# Patient Record
Sex: Female | Born: 1975 | Race: White | Hispanic: No | Marital: Married | Smoking: Current every day smoker
Health system: Southern US, Community
[De-identification: ages and names within clinical notes are randomized; demographics above are authoritative.]

## PROBLEM LIST (undated history)

## (undated) ENCOUNTER — Emergency Department (HOSPITAL_COMMUNITY): Admission: EM | Payer: Medicaid Other | Source: Home / Self Care

## (undated) DIAGNOSIS — Z789 Other specified health status: Secondary | ICD-10-CM

## (undated) DIAGNOSIS — C801 Malignant (primary) neoplasm, unspecified: Secondary | ICD-10-CM

## (undated) DIAGNOSIS — T783XXA Angioneurotic edema, initial encounter: Secondary | ICD-10-CM

## (undated) DIAGNOSIS — T7840XA Allergy, unspecified, initial encounter: Secondary | ICD-10-CM

## (undated) HISTORY — PX: DENTAL SURGERY: SHX609

## (undated) HISTORY — DX: Angioneurotic edema, initial encounter: T78.3XXA

## (undated) HISTORY — DX: Allergy, unspecified, initial encounter: T78.40XA

## (undated) HISTORY — DX: Malignant (primary) neoplasm, unspecified: C80.1

---
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
## 2004-05-31 ENCOUNTER — Emergency Department (HOSPITAL_COMMUNITY): Admission: EM | Admit: 2004-05-31 | Discharge: 2004-05-31 | Payer: Self-pay | Admitting: Emergency Medicine

## 2005-08-03 ENCOUNTER — Emergency Department (HOSPITAL_COMMUNITY): Admission: EM | Admit: 2005-08-03 | Discharge: 2005-08-04 | Payer: Self-pay | Admitting: Emergency Medicine

## 2007-04-28 ENCOUNTER — Emergency Department (HOSPITAL_COMMUNITY): Admission: EM | Admit: 2007-04-28 | Discharge: 2007-04-28 | Payer: Self-pay | Admitting: Emergency Medicine

## 2007-06-26 ENCOUNTER — Emergency Department (HOSPITAL_COMMUNITY): Admission: EM | Admit: 2007-06-26 | Discharge: 2007-06-26 | Payer: Self-pay | Admitting: Emergency Medicine

## 2008-01-26 ENCOUNTER — Emergency Department (HOSPITAL_COMMUNITY): Admission: EM | Admit: 2008-01-26 | Discharge: 2008-01-26 | Payer: Self-pay | Admitting: Emergency Medicine

## 2008-01-31 ENCOUNTER — Emergency Department (HOSPITAL_COMMUNITY): Admission: EM | Admit: 2008-01-31 | Discharge: 2008-01-31 | Payer: Self-pay | Admitting: Emergency Medicine

## 2008-07-05 ENCOUNTER — Emergency Department (HOSPITAL_BASED_OUTPATIENT_CLINIC_OR_DEPARTMENT_OTHER): Admission: EM | Admit: 2008-07-05 | Discharge: 2008-07-05 | Payer: Self-pay | Admitting: Emergency Medicine

## 2008-11-28 ENCOUNTER — Inpatient Hospital Stay (HOSPITAL_COMMUNITY): Admission: AD | Admit: 2008-11-28 | Discharge: 2008-11-28 | Payer: Self-pay | Admitting: Obstetrics

## 2009-02-23 ENCOUNTER — Inpatient Hospital Stay (HOSPITAL_COMMUNITY): Admission: AD | Admit: 2009-02-23 | Discharge: 2009-02-26 | Payer: Self-pay | Admitting: Obstetrics

## 2009-10-22 ENCOUNTER — Emergency Department (HOSPITAL_COMMUNITY): Admission: EM | Admit: 2009-10-22 | Discharge: 2009-10-22 | Payer: Self-pay | Admitting: Family Medicine

## 2010-02-04 ENCOUNTER — Ambulatory Visit: Payer: Self-pay | Admitting: Obstetrics & Gynecology

## 2010-02-04 ENCOUNTER — Emergency Department (HOSPITAL_COMMUNITY): Admission: EM | Admit: 2010-02-04 | Discharge: 2010-02-04 | Payer: Self-pay | Admitting: Emergency Medicine

## 2010-02-04 ENCOUNTER — Inpatient Hospital Stay (HOSPITAL_COMMUNITY): Admission: AD | Admit: 2010-02-04 | Discharge: 2010-02-04 | Payer: Self-pay | Admitting: Obstetrics & Gynecology

## 2010-02-22 ENCOUNTER — Ambulatory Visit: Payer: Self-pay | Admitting: Obstetrics and Gynecology

## 2010-03-10 ENCOUNTER — Emergency Department (HOSPITAL_COMMUNITY): Admission: EM | Admit: 2010-03-10 | Discharge: 2010-03-10 | Payer: Self-pay | Admitting: Family Medicine

## 2011-01-23 LAB — POCT INFECTIOUS MONO SCREEN: Mono Screen: NEGATIVE

## 2011-01-23 LAB — POCT RAPID STREP A (OFFICE): Streptococcus, Group A Screen (Direct): NEGATIVE

## 2011-02-05 LAB — CULTURE, ROUTINE-ABSCESS

## 2011-02-14 LAB — CBC
HCT: 27.6 % — ABNORMAL LOW (ref 36.0–46.0)
HCT: 37.8 % (ref 36.0–46.0)
Hemoglobin: 13 g/dL (ref 12.0–15.0)
Hemoglobin: 9.4 g/dL — ABNORMAL LOW (ref 12.0–15.0)
MCHC: 34.2 g/dL (ref 30.0–36.0)
MCHC: 34.5 g/dL (ref 30.0–36.0)
MCV: 92 fL (ref 78.0–100.0)
MCV: 93.1 fL (ref 78.0–100.0)
Platelets: 143 10*3/uL — ABNORMAL LOW (ref 150–400)
Platelets: 164 10*3/uL (ref 150–400)
RBC: 2.97 MIL/uL — ABNORMAL LOW (ref 3.87–5.11)
RBC: 4.11 MIL/uL (ref 3.87–5.11)
RDW: 13.7 % (ref 11.5–15.5)
RDW: 13.8 % (ref 11.5–15.5)
WBC: 16.8 10*3/uL — ABNORMAL HIGH (ref 4.0–10.5)
WBC: 17.3 10*3/uL — ABNORMAL HIGH (ref 4.0–10.5)

## 2011-02-14 LAB — RPR: RPR Ser Ql: NONREACTIVE

## 2011-03-20 NOTE — Op Note (Signed)
Shari Stewart, Shari Stewart              ACCOUNT NO.:  [id number]   MEDICAL RECORD NO.:  [id number]          PATIENT TYPE:  INP   LOCATION:  9110                          FACILITY:  [facility]   PHYSICIAN:  Kathreen Cosier, M.D.DATE OF BIRTH:  Jan 08, 1976   DATE OF PROCEDURE:  02/24/2009  DATE OF DISCHARGE:                               OPERATIVE REPORT   PREOPERATIVE DIAGNOSIS:  Failure to progress in labor at term.   POSTOPERATIVE DIAGNOSIS:  Failure to progress in labor at term.   SURGEON:  Kathreen Cosier, M.D.   ANESTHESIA:  Epidural.   DESCRIPTION OF PROCEDURE:  The patient placed on the operating table in  supine position.  Abdomen prepped and draped.  Bladder emptied with a  Foley catheter.  Transverse suprapubic incision made, carried down to  rectus fascia.  Fascia cleaned and incised to the length of the  incision.  Recti muscles retracted laterally.  Peritoneum incised  longitudinally.  Transverse incision made in the visceral peritoneum  above the bladder.  Bladder mobilized inferiorly.  Transverse lower  uterine incision made.  The patient delivered from the OP position of a  female Apgar are 9 and 9, weighing 8 pounds 11 ounces.  Team was in  attendance.  Fluid clear.  Placenta anterior removed manually and sent  to Labor and Delivery.  Uterine cavity cleaned with dry laps.  Uterine  incision closed in 2 layers with continuous suture of #1 chromic.  Hemostasis was satisfactory.  Bladder flap reattached with 2-0 chromic.  Uterus well contracted.  Tubes and ovaries normal.  Abdomen was closed  in layers, peritoneum continuous suture of 0 chromic, fascia with  continuous suture with 0 Dexon, and skin closed with subcuticular stitch  of 4-0 Monocryl.  Blood loss 600 mL.           ______________________________  Kathreen Cosier, M.D.     BAM/MEDQ  D:  02/24/2009  T:  02/24/2009  Job:  [id number]

## 2011-03-20 NOTE — H&P (Signed)
NAMEBRIANNE, Shari Stewart              ACCOUNT NO.:  [id number]   MEDICAL RECORD NO.:  [id number]          PATIENT TYPE:  INP   LOCATION:  9110                          [facility]ACILITY:  [facility]   PHYSICIAN:  Kathreen Cosier, M.D.DATE OF BIRTH:  09-24-1976   DATE OF ADMISSION:  02/23/2009  DATE OF DISCHARGE:                              HISTORY & PHYSICAL   The patient is a 35 year old gravida 6, para 0-0-5-0, [facility] February 21, 2009, who was admitted to labor.  She had a positive group B  Streptococcus, and at 7:30 p.m. on February 23, 2009, she was 9 cm vertex  and -1 station.  Membranes were  ruptured at 7 a.m., fluid was clear.  She had a positive GBS and got penicillin on admission.  The patient  soon became fully dilated and by 11:55 p.m. had been pushing for 3 hours  with no descent of the vertex beyond of +1 station and there was  significant molding.  It was decided she would be delivered by a C-  section because of failure to progress in labor.   PHYSICAL EXAMINATION:  GENERAL:  A well-developed female in labor.  HEENT:  Negative.  LUNGS:  Clear.  HEART:  Regular rhythm.  No murmurs or gallops.  BREASTS:  No masses.  ABDOMEN:  Term-size uterus.  Estimated fetal weight was 7 or 8.  PELVIC:  As described above.  EXTREMITIES:  Negative.           ______________________________  Kathreen Cosier, M.D.     BAM/MEDQ  D:  02/24/2009  T:  02/24/2009  Job:  [id number]

## 2011-03-20 NOTE — Discharge Summary (Signed)
[name], Shari Stewart              ACCOUNT NO.:  [id number]   MEDICAL RECORD NO.:  [id number]          PATIENT TYPE:  INP   LOCATION:  9110                          [facility]   PHYSICIAN:  Roseanna Rainbow, M.D.DATE OF BIRTH:  22-Mar-1976   DATE OF ADMISSION:  02/23/2009  DATE OF DISCHARGE:  02/26/2009                               DISCHARGE SUMMARY   CHIEF COMPLAINT:  The patient is a 35 year old gravida 6, para 0 with an  estimated date of confinement of April 19, complaining of contractions.   HISTORY OF PRESENT ILLNESS:  Please see the above OB risk factors.  GBS  positive.   REVIEW OF SYSTEMS:  Please see the above.   PHYSICAL EXAMINATION:  VITAL SIGNS:  Stable, afebrile.  HEAD, EYES, EARS, NOSE AND THROAT:  Normocephalic and atraumatic.  LUNGS:  Clear to auscultation bilaterally.  HEART:  Regular rate and rhythm.  No murmurs, rubs, or gallops.  ABDOMEN:  Term.  EXTREMITIES:  Nontender.  PELVIC:  Cervix 9 cm, dilated with a vertex at a -1 station; the  membranes were artificially ruptured for clear fluid.   ASSESSMENT:  Intrauterine pregnancy at term, active labor.   PLAN:  Admission, expectant management, anticipated vaginal delivery.   HOSPITAL COURSE:  The patient was admitted.  She progressed to fully  dilated.  However, there was arrest of descent and she was brought for a  cesarean delivery.  Please see the dictated operative summary.  On  postoperative day #1, her hemoglobin was 9.4.  A psychosocial assessment  was performed for possible history of alcoholism.  However, upon further  queries, the patient had previously been charged with a DUI, denied any  substance abuse or alcoholism.  The meconium was sent for a drug screen.  The remainder of the patient's hospital course was uneventful.  She was  discharged to home on postoperative day 2.   DISCHARGE DIAGNOSES:  Intrauterine pregnancy at term, arrest of descent,  active labor.   PROCEDURE:  Cesarean  delivery.   CONDITION:  Stable.   DIET:  Regular.   ACTIVITY:  Pelvic rest, progressive activity.   MEDICATIONS:  Percocet 1-2 tablets every 6 hours as needed.   DISPOSITION:  The patient was to arrange followup with Dr. Gaynell Face for  a postpartum check in 6 weeks and to schedule a circumcision for the  infant.      Roseanna Rainbow, M.D.  Electronically Signed     LAJ/MEDQ  D:  02/26/2009  T:  02/26/2009  Job:  [id number]   cc:   Kathreen Cosier, M.D.  Fax: [phone]

## 2011-06-19 ENCOUNTER — Inpatient Hospital Stay (INDEPENDENT_AMBULATORY_CARE_PROVIDER_SITE_OTHER)
Admission: RE | Admit: 2011-06-19 | Discharge: 2011-06-19 | Disposition: A | Discharge status: Home / Self Care | Payer: Self-pay | Source: Ambulatory Visit | Attending: Family Medicine | Admitting: Family Medicine

## 2011-06-19 DIAGNOSIS — W57XXXA Bitten or stung by nonvenomous insect and other nonvenomous arthropods, initial encounter: Secondary | ICD-10-CM

## 2011-07-30 LAB — CULTURE, ROUTINE-ABSCESS: Culture: NO GROWTH

## 2011-08-17 LAB — BASIC METABOLIC PANEL
BUN: 7
CO2: 26
Calcium: 9
Chloride: 104
Creatinine, Ser: 0.68
GFR calc Af Amer: >60
GFR calc non Af Amer: >60
Glucose, Bld: 98
Potassium: 4
Sodium: 139

## 2011-08-17 LAB — ETHANOL: Alcohol, Ethyl (B): 107 — ABNORMAL HIGH

## 2012-06-15 ENCOUNTER — Emergency Department (HOSPITAL_COMMUNITY)
Admission: EM | Admit: 2012-06-15 | Discharge: 2012-06-15 | Disposition: A | Discharge status: Home / Self Care | Payer: Self-pay | Attending: Emergency Medicine | Admitting: Emergency Medicine

## 2012-06-15 ENCOUNTER — Encounter (HOSPITAL_COMMUNITY): Payer: Self-pay | Admitting: Physical Medicine and Rehabilitation

## 2012-06-15 DIAGNOSIS — F17200 Nicotine dependence, unspecified, uncomplicated: Secondary | ICD-10-CM | POA: Insufficient documentation

## 2012-06-15 DIAGNOSIS — J020 Streptococcal pharyngitis: Secondary | ICD-10-CM | POA: Insufficient documentation

## 2012-06-15 LAB — RAPID STREP SCREEN (MED CTR MEBANE ONLY): Streptococcus, Group A Screen (Direct): POSITIVE — AB

## 2012-06-15 MED ORDER — DEXAMETHASONE SODIUM PHOSPHATE 10 MG/ML IJ SOLN
10.0000 mg | Freq: Once | INTRAMUSCULAR | Status: DC
  Filled 2012-06-15: qty 1

## 2012-06-15 MED ORDER — DEXAMETHASONE SODIUM PHOSPHATE 10 MG/ML IJ SOLN
10.0000 mg | Freq: Once | INTRAMUSCULAR | Status: AC
  Administered 2012-06-15: 10 mg via INTRAMUSCULAR

## 2012-06-15 MED ORDER — PENICILLIN G BENZATHINE 1200000 UNIT/2ML IM SUSP
1.2000 10*6.[IU] | Freq: Once | INTRAMUSCULAR | Status: AC
  Administered 2012-06-15: 1.2 10*6.[IU] via INTRAMUSCULAR
  Filled 2012-06-15: qty 2

## 2012-06-15 NOTE — ED Notes (Signed)
Pt presents to department for evaluation of sore throat and fever. Ongoing x1 day. Pt states fever of 100.2, also states difficulty swallowing and white exudate on both tonsils. 8/10 pain at the time. She is alert and oriented x4. Respirations unlabored. No signs of distress noted.

## 2012-06-15 NOTE — ED Provider Notes (Signed)
History     CSN: [id number]  Arrival date & time 06/15/12  4098   First MD Initiated Contact with Patient 06/15/12 [phone]      Chief Complaint  Patient presents with  . Sore Throat    (Consider location/radiation/quality/duration/timing/severity/associated sxs/prior treatment) Patient is a 36 y.o. female presenting with pharyngitis. The history is provided by the patient.  Sore Throat This is a new problem. The current episode started yesterday. The problem occurs constantly. The problem has been gradually worsening. Pertinent negatives include no chest pain, no abdominal pain and no shortness of breath. Associated symptoms comments: Fever, no cough, rhinorrhea or other complaints. The symptoms are aggravated by swallowing. Nothing relieves the symptoms. Treatments tried: NSAIDs. The treatment provided mild relief.    No past medical history on file.  No past surgical history on file.  No family history on file.  History  Substance Use Topics  . Smoking status: Current Everyday Smoker    Types: Cigarettes  . Smokeless tobacco: Not on file  . Alcohol Use: No    OB History    Grav Para Term Preterm Abortions TAB SAB Ect Mult Living                  Review of Systems  Respiratory: Negative for cough and shortness of breath.   Cardiovascular: Negative for chest pain.  Gastrointestinal: Negative for nausea, vomiting and abdominal pain.  All other systems reviewed and are negative.    Allergies  Bee pollen and Erythromycin  Home Medications   Current Outpatient Rx  Name Route Sig Dispense Refill  . IBUPROFEN 200 MG PO TABS Oral Take 400-600 mg by mouth every 8 (eight) hours as needed. For pain      BP 112/79  Pulse 92  Temp 98.3 F (36.8 C) (Oral)  Resp 16  SpO2 98%  Physical Exam  Nursing note and vitals reviewed. Constitutional: She is oriented to person, place, and time. She appears well-developed and well-nourished. No distress.  HENT:  Head:  Normocephalic and atraumatic.  Right Ear: Tympanic membrane and ear canal normal.  Left Ear: Tympanic membrane and ear canal normal.  Nose: No mucosal edema or rhinorrhea.  Mouth/Throat: Oropharyngeal exudate and posterior oropharyngeal erythema present. No tonsillar abscesses.  Eyes: Conjunctivae and EOM are normal. Pupils are equal, round, and reactive to light.  Neck: Normal range of motion. Neck supple.  Cardiovascular: Normal rate, regular rhythm and intact distal pulses.   No murmur heard. Pulmonary/Chest: Effort normal and breath sounds normal. No respiratory distress. She has no wheezes. She has no rales.  Abdominal: Soft. She exhibits no distension. There is no tenderness. There is no rebound and no guarding.  Musculoskeletal: Normal range of motion. She exhibits no edema and no tenderness.  Lymphadenopathy:    She has cervical adenopathy.  Neurological: She is alert and oriented to person, place, and time.  Skin: Skin is warm and dry. No rash noted. No erythema.  Psychiatric: She has a normal mood and affect. Her behavior is normal.    ED Course  Procedures (including critical care time)   Labs Reviewed  RAPID STREP SCREEN   No results found.   1. Strep pharyngitis       MDM   Patient with a history consistent with strep throat. She has 4 out of 4 Center criteria with exudates on her tonsils, lymphadenopathy, no cough and a fever yesterday. Last episode of strep throat the patient had was 3 years ago. No  known sick contacts.  Patient was treated with IM penicillin and Decadron       Gwyneth Sprout, MD 06/15/12 [phone]

## 2012-06-15 NOTE — ED Notes (Signed)
Pt discharged home. Had no further questions. 

## 2012-06-15 NOTE — Discharge Instructions (Signed)

## 2013-05-18 ENCOUNTER — Other Ambulatory Visit: Payer: Self-pay | Admitting: Obstetrics & Gynecology

## 2013-05-26 ENCOUNTER — Encounter (HOSPITAL_COMMUNITY): Payer: Self-pay | Admitting: *Deleted

## 2013-06-01 ENCOUNTER — Inpatient Hospital Stay (HOSPITAL_COMMUNITY): Payer: BC Managed Care – PPO | Admitting: Anesthesiology

## 2013-06-01 ENCOUNTER — Ambulatory Visit (HOSPITAL_COMMUNITY)
Admission: RE | Admit: 2013-06-01 | Discharge: 2013-06-01 | Disposition: A | Discharge status: Home / Self Care | Payer: BC Managed Care – PPO | Source: Ambulatory Visit | Attending: Obstetrics & Gynecology | Admitting: Obstetrics & Gynecology

## 2013-06-01 ENCOUNTER — Encounter (HOSPITAL_COMMUNITY): Payer: Self-pay | Admitting: Anesthesiology

## 2013-06-01 ENCOUNTER — Encounter (HOSPITAL_COMMUNITY): Payer: Self-pay | Admitting: Pharmacy Technician

## 2013-06-01 ENCOUNTER — Encounter (HOSPITAL_COMMUNITY)
Admission: RE | Disposition: A | Discharge status: Home / Self Care | Payer: Self-pay | Source: Ambulatory Visit | Attending: Obstetrics & Gynecology

## 2013-06-01 DIAGNOSIS — N750 Cyst of Bartholin's gland: Secondary | ICD-10-CM | POA: Insufficient documentation

## 2013-06-01 HISTORY — DX: Other specified health status: Z78.9

## 2013-06-01 HISTORY — PX: BARTHOLIN CYST MARSUPIALIZATION: SHX5383

## 2013-06-01 LAB — CBC
HCT: 41.4 % (ref 36.0–46.0)
Hemoglobin: 14.1 g/dL (ref 12.0–15.0)
MCH: 31.3 pg (ref 26.0–34.0)
MCHC: 34.1 g/dL (ref 30.0–36.0)
MCV: 91.8 fL (ref 78.0–100.0)
Platelets: 212 10*3/uL (ref 150–400)
RBC: 4.51 MIL/uL (ref 3.87–5.11)
RDW: 13.4 % (ref 11.5–15.5)
WBC: 6 10*3/uL (ref 4.0–10.5)

## 2013-06-01 LAB — PREGNANCY, URINE: Preg Test, Ur: NEGATIVE

## 2013-06-01 SURGERY — MARSUPIALIZATION, CYST, BARTHOLIN'S GLAND
Anesthesia: General | Site: Vagina | Laterality: Right | Wound class: Clean Contaminated

## 2013-06-01 MED ORDER — KETOROLAC TROMETHAMINE 30 MG/ML IJ SOLN
INTRAMUSCULAR | Status: AC
  Filled 2013-06-01: qty 1

## 2013-06-01 MED ORDER — FENTANYL CITRATE 0.05 MG/ML IJ SOLN: 25.0000 ug | INTRAMUSCULAR | Status: DC | PRN

## 2013-06-01 MED ORDER — PROPOFOL 10 MG/ML IV EMUL
INTRAVENOUS | Status: AC
  Filled 2013-06-01: qty 20

## 2013-06-01 MED ORDER — MIDAZOLAM HCL 5 MG/5ML IJ SOLN
INTRAMUSCULAR | Status: DC | PRN
  Administered 2013-06-01: 2 mg via INTRAVENOUS

## 2013-06-01 MED ORDER — MIDAZOLAM HCL 2 MG/2ML IJ SOLN
INTRAMUSCULAR | Status: AC
  Filled 2013-06-01: qty 2

## 2013-06-01 MED ORDER — MIDAZOLAM HCL 2 MG/2ML IJ SOLN: 0.5000 mg | Freq: Once | INTRAMUSCULAR | Status: DC | PRN

## 2013-06-01 MED ORDER — LIDOCAINE HCL (CARDIAC) 20 MG/ML IV SOLN
INTRAVENOUS | Status: AC
  Filled 2013-06-01: qty 5

## 2013-06-01 MED ORDER — LIDOCAINE HCL (CARDIAC) 20 MG/ML IV SOLN
INTRAVENOUS | Status: DC | PRN
  Administered 2013-06-01: 30 mg via INTRAVENOUS
  Administered 2013-06-01: 70 mg via INTRAVENOUS

## 2013-06-01 MED ORDER — FENTANYL CITRATE 0.05 MG/ML IJ SOLN
INTRAMUSCULAR | Status: DC | PRN
  Administered 2013-06-01 (×3): 50 ug via INTRAVENOUS

## 2013-06-01 MED ORDER — ONDANSETRON HCL 4 MG/2ML IJ SOLN
INTRAMUSCULAR | Status: AC
  Filled 2013-06-01: qty 2

## 2013-06-01 MED ORDER — PROMETHAZINE HCL 25 MG/ML IJ SOLN: 6.2500 mg | INTRAMUSCULAR | Status: DC | PRN

## 2013-06-01 MED ORDER — LACTATED RINGERS IV SOLN
INTRAVENOUS | Status: DC
  Administered 2013-06-01 (×2): via INTRAVENOUS

## 2013-06-01 MED ORDER — PROPOFOL 10 MG/ML IV BOLUS
INTRAVENOUS | Status: DC | PRN
  Administered 2013-06-01: 200 mg via INTRAVENOUS

## 2013-06-01 MED ORDER — KETOROLAC TROMETHAMINE 30 MG/ML IJ SOLN
INTRAMUSCULAR | Status: DC | PRN
  Administered 2013-06-01 (×2): 30 mg via INTRAVENOUS

## 2013-06-01 MED ORDER — LIDOCAINE HCL 2 % IJ SOLN
INTRAMUSCULAR | Status: AC
  Filled 2013-06-01: qty 20

## 2013-06-01 MED ORDER — HYDROCODONE-IBUPROFEN 5-200 MG PO TABS: 1.0000 | ORAL_TABLET | Freq: Three times a day (TID) | ORAL | Status: DC | PRN

## 2013-06-01 MED ORDER — MEPERIDINE HCL 25 MG/ML IJ SOLN: 6.2500 mg | INTRAMUSCULAR | Status: DC | PRN

## 2013-06-01 MED ORDER — ONDANSETRON HCL 4 MG/2ML IJ SOLN
INTRAMUSCULAR | Status: DC | PRN
  Administered 2013-06-01: 4 mg via INTRAVENOUS

## 2013-06-01 MED ORDER — KETOROLAC TROMETHAMINE 30 MG/ML IJ SOLN: 15.0000 mg | Freq: Once | INTRAMUSCULAR | Status: DC | PRN

## 2013-06-01 MED ORDER — FENTANYL CITRATE 0.05 MG/ML IJ SOLN
INTRAMUSCULAR | Status: AC
  Filled 2013-06-01: qty 5

## 2013-06-01 MED ORDER — LIDOCAINE HCL 2 % IJ SOLN
INTRAMUSCULAR | Status: DC | PRN
  Administered 2013-06-01: 10 mL

## 2013-06-01 SURGICAL SUPPLY — 27 items
BLADE SURG 15 STRL LF C SS BP (BLADE) ×1 IMPLANT
BLADE SURG 15 STRL SS (BLADE) ×2
CLOTH BEACON ORANGE TIMEOUT ST (SAFETY) ×2 IMPLANT
CONTAINER PREFILL 10% NBF 15ML (MISCELLANEOUS) IMPLANT
COUNTER NEEDLE 1200 MAGNETIC (NEEDLE) IMPLANT
DRESSING TELFA 8X3 (GAUZE/BANDAGES/DRESSINGS) ×1 IMPLANT
ELECT REM PT RETURN 9FT ADLT (ELECTROSURGICAL)
ELECTRODE REM PT RTRN 9FT ADLT (ELECTROSURGICAL) IMPLANT
GAUZE PACKING 1 X5 YD ST (GAUZE/BANDAGES/DRESSINGS) ×1 IMPLANT
GAUZE PACKING 1/2 X5 YD (GAUZE/BANDAGES/DRESSINGS) IMPLANT
GLOVE BIO SURGEON STRL SZ 6.5 (GLOVE) ×2 IMPLANT
GLOVE BIOGEL PI IND STRL 7.0 (GLOVE) ×1 IMPLANT
GLOVE BIOGEL PI INDICATOR 7.0 (GLOVE) ×1
GOWN STRL REIN XL XLG (GOWN DISPOSABLE) ×4 IMPLANT
NEEDLE HYPO 22GX1.5 SAFETY (NEEDLE) ×1 IMPLANT
PACK VAGINAL MINOR WOMEN LF (CUSTOM PROCEDURE TRAY) ×2 IMPLANT
PAD OB MATERNITY 4.3X12.25 (PERSONAL CARE ITEMS) ×2 IMPLANT
PAD PREP 24X48 CUFFED NSTRL (MISCELLANEOUS) ×2 IMPLANT
PENCIL BUTTON HOLSTER BLD 10FT (ELECTRODE) IMPLANT
SUT VICRYL RAPIDE 4/0 PS 2 (SUTURE) ×6 IMPLANT
SWAB CULTURE LIQ STUART DBL (MISCELLANEOUS) IMPLANT
SYR CONTROL 10ML LL (SYRINGE) ×1 IMPLANT
TOWEL OR 17X24 6PK STRL BLUE (TOWEL DISPOSABLE) ×4 IMPLANT
TUBE ANAEROBIC SPECIMEN COL (MISCELLANEOUS) IMPLANT
TUBING NON-CON 1/4 X 20 CONN (TUBING) IMPLANT
WATER STERILE IRR 1000ML POUR (IV SOLUTION) ×3 IMPLANT
YANKAUER SUCT BULB TIP NO VENT (SUCTIONS) IMPLANT

## 2013-06-01 NOTE — Op Note (Signed)
06/01/2013  8:30 AM  PATIENT:  Shari Stewart  37 y.o. female  PRE-OPERATIVE DIAGNOSIS:  Recurrent right Bartholin's Gland Cyst    POST-OPERATIVE DIAGNOSIS:  Recurrent right Bartholin's Gland cyst   PROCEDURE:  Procedure(s): RIGHT BARTHOLIN CYST MARSUPIALIZATION  SURGEON:  Surgeon(s): Genia Del, MD  ASSISTANTS: none   ANESTHESIA:   general  ESTIMATED BLOOD LOSS: 621308   Intake/Output Summary (Last 24 hours) at 06/01/13 0830 Last data filed at 06/01/13 6578  Gross per 24 hour  Intake      0 ml  Output     60 ml  Net    -60 ml     BLOOD ADMINISTERED:none   LOCAL MEDICATIONS USED:  LIDOCAINE 2% 10 cc   SPECIMEN:  No Specimen  DISPOSITION OF SPECIMEN:  N/A  COUNTS:  YES  DICTATION #:   PLAN OF CARE: Transfer to PACU  Genia Del MD  06/01/2013   8:30 am

## 2013-06-01 NOTE — Discharge Summary (Signed)
  Physician Discharge Summary  Patient ID: Shari Stewart MRN: [id number] DOB/AGE: [birth year] 37 y.o.  Admit date: 06/01/2013 Discharge date: 06/01/2013  Admission Diagnoses: Bartholin's Gland Abscess  40981  Discharge Diagnoses: Bartholin's Gland Abscess  19147        Active Problems:   * No active hospital problems. *   Discharged Condition: good  Hospital Course: Outpatient  Consults: None  Treatments: surgery: Right Bartholin gland cyst Marsupialisation  Disposition: 01-Home or Self Care     Medication List         amphetamine-dextroamphetamine 10 MG tablet  Commonly known as:  ADDERALL  Take 10 mg by mouth daily.     cetirizine 10 MG tablet  Commonly known as:  ZYRTEC  Take 10 mg by mouth daily.     hydrocodone-ibuprofen 5-200 MG per tablet  Commonly known as:  VICOPROFEN  Take 1 tablet by mouth every 8 (eight) hours as needed for pain.     ibuprofen 200 MG tablet  Commonly known as:  ADVIL,MOTRIN  Take 400-600 mg by mouth every 8 (eight) hours as needed. For pain     lisdexamfetamine 70 MG capsule  Commonly known as:  VYVANSE  Take 70 mg by mouth daily.     NEXPLANON 68 MG Impl implant  Generic drug:  etonogestrel  Inject 1 each into the skin once.     valACYclovir 500 MG tablet  Commonly known as:  VALTREX  Take 500 mg by mouth daily as needed (for cold sores).     Vitamin D (Ergocalciferol) 50000 UNITS Caps  Commonly known as:  DRISDOL  Take 50,000 Units by mouth every 7 (seven) days. thursdays           Follow-up Information   Follow up with Leni Pankonin,MARIE-LYNE, MD In 3 weeks.   Contact information:   [address] [place] [address] [phone]       Signed: Genia Del, MD 06/01/2013, 8:42 AM

## 2013-06-01 NOTE — Op Note (Signed)
Shari Stewart, [place] NO.:  [id number]  MEDICAL RECORD NO.:  [id number]  LOCATION:  [facility]                          [facility]ILITY[facility]  PHYSICIAN:  Genia Del, M.D.DATE OF BIRTH:  1976/01/01  DATE OF PROCEDURE:  06/01/2013 DATE OF DISCHARGE:  06/01/2013                              OPERATIVE REPORT   PREOPERATIVE DIAGNOSIS:  Recurrent right Bartholin cyst.  POSTOPERATIVE DIAGNOSIS:  Recurrent right Bartholin cyst.  PROCEDURE:  Right Bartholin cyst marsupialization.  SURGEON:  Genia Del, M.D.  ASSISTANT:  None.  ANESTHESIOLOGIST:  Belva Agee, M.D.  ANESTHESIA:  General.  PROCEDURE:  Under general anesthesia with laryngeal mask, the patient was in lithotomy position.  She was prepped with Hibiclens on the suprapubic vulvar and vaginal areas.  The bladder was catheterized.  We draped the patient as usual.  Infiltration of lidocaine 2%, 10 mL at the right Bartholin gland cyst, close to the hymen.  We make an incision over 1.5 cm with a scalpel.  We anterior the Bartholin gland cyst and a thick mucusy transparent fluid evacuates.  We used Vicryl 4-0 in a separate stitches all around the opening of the Bartholin gland cyst to marsupialize.  Each stitch includes the vulvar skin and the wall of the Bartholin gland cyst.  We do about 9 separate stitches.  Hemostasis was completed when necessary with the electrocautery.  We then packed the Bartholin gland with a 1 inch plain pack.  The count of instruments and sponges was complete.  Hemostasis was adequate.  The patient was brought to recovery room in good and stable status.     Genia Del, M.D.     ML/MEDQ  D:  06/01/2013  T:  06/01/2013  Job:  [id number]

## 2013-06-01 NOTE — H&P (Signed)
Shari Stewart is an 37 y.o. female   RP:  Rt Bartholin gland cyst  Pertinent Gynecological History:  Blood transfusions: none Sexually transmitted diseases: no past history  Last mammogram: normal  Last pap: normal     Menstrual History:  Patient's last menstrual period was 03/02/2013.    Past Medical History  Diagnosis Date  . Medical history non-contributory     Past Surgical History  Procedure Laterality Date  . Cesarean section    . Cervical biopsy  w/ loop electrode excision  1991    History reviewed. No pertinent family history.  Social History:  reports that she has been smoking Cigarettes.  She has been smoking about 0.50 packs per day. She does not have any smokeless tobacco history on file. She reports that she drinks about 1.2 ounces of alcohol per week. She reports that she does not use illicit drugs.  Allergies:  Allergies  Allergen Reactions  . Bee Pollen Swelling  . Betadine (Povidone Iodine)     BLISTERS  . Erythromycin Hives and Nausea And Vomiting    Prescriptions prior to admission  Medication Sig Dispense Refill  . amphetamine-dextroamphetamine (ADDERALL) 10 MG tablet Take 10 mg by mouth daily.      . cetirizine (ZYRTEC) 10 MG tablet Take 10 mg by mouth daily.      Marland Kitchen etonogestrel (NEXPLANON) 68 MG IMPL implant Inject 1 each into the skin once.      Marland Kitchen ibuprofen (ADVIL,MOTRIN) 200 MG tablet Take 400-600 mg by mouth every 8 (eight) hours as needed. For pain      . lisdexamfetamine (VYVANSE) 70 MG capsule Take 70 mg by mouth daily.      . valACYclovir (VALTREX) 500 MG tablet Take 500 mg by mouth daily as needed (for cold sores).      . Vitamin D, Ergocalciferol, (DRISDOL) 50000 UNITS CAPS Take 50,000 Units by mouth every 7 (seven) days. thursdays        Blood pressure 119/80, temperature 98.1 F (36.7 C), temperature source Oral, resp. rate 16, height 5' 6.5" (1.689 m), weight 58.514 kg (129 lb), last menstrual period 03/02/2013, SpO2  99.00%.  Rt Bartholin gland cyst.   Results for orders placed during the hospital encounter of 06/01/13 (from the past 24 hour(s))  PREGNANCY, URINE     Status: None   Collection Time    06/01/13  6:28 AM      Result Value Range   Preg Test, Ur NEGATIVE  NEGATIVE  CBC     Status: None   Collection Time    06/01/13  6:35 AM      Result Value Range   WBC 6.0  4.0 - 10.5 K/uL   RBC 4.51  3.87 - 5.11 MIL/uL   Hemoglobin 14.1  12.0 - 15.0 g/dL   HCT 16.1  09.6 - 04.5 %   MCV 91.8  78.0 - 100.0 fL   MCH 31.3  26.0 - 34.0 pg   MCHC 34.1  30.0 - 36.0 g/dL   RDW 40.9  81.1 - 91.4 %   Platelets 212  150 - 400 K/uL    No results found.  Assessment/Plan: Recurrent Rt Bartholin cyst for Marsupialisation.  Shari Stewart,MARIE-LYNE 06/01/2013, 7:45 AM

## 2013-06-01 NOTE — Transfer of Care (Signed)
Immediate Anesthesia Transfer of Care Note  Patient: Shari Stewart  Procedure(s) Performed: Procedure(s): BARTHOLIN CYST MARSUPIALIZATION (Right)  Patient Location: PACU  Anesthesia Type:General  Level of Consciousness: awake, alert , oriented and patient cooperative  Airway & Oxygen Therapy: Patient Spontanous Breathing and Patient connected to nasal cannula oxygen  Post-op Assessment: Report given to PACU RN and Post -op Vital signs reviewed and stable  Post vital signs: Reviewed and stable  Complications: No apparent anesthesia complications

## 2013-06-01 NOTE — Anesthesia Preprocedure Evaluation (Signed)
Anesthesia Evaluation  Patient identified by MRN, date of birth, ID band Patient awake    Reviewed: Allergy & Precautions, H&P , Patient's Chart, lab work & pertinent test results, reviewed documented beta blocker date and time   History of Anesthesia Complications Negative for: history of anesthetic complications  Airway Mallampati: II TM Distance: >3 FB Neck ROM: full    Dental no notable dental hx.    Pulmonary neg pulmonary ROS,  breath sounds clear to auscultation  Pulmonary exam normal       Cardiovascular Exercise Tolerance: Good negative cardio ROS  Rhythm:regular Rate:Normal     Neuro/Psych negative neurological ROS  negative psych ROS   GI/Hepatic negative GI ROS, Neg liver ROS,   Endo/Other  negative endocrine ROS  Renal/GU negative Renal ROS     Musculoskeletal   Abdominal   Peds  Hematology negative hematology ROS (+)   Anesthesia Other Findings   Reproductive/Obstetrics negative OB ROS                           Anesthesia Physical Anesthesia Plan  ASA: II  Anesthesia Plan: General LMA   Post-op Pain Management:    Induction:   Airway Management Planned:   Additional Equipment:   Intra-op Plan:   Post-operative Plan:   Informed Consent: I have reviewed the patients History and Physical, chart, labs and discussed the procedure including the risks, benefits and alternatives for the proposed anesthesia with the patient or authorized representative who has indicated his/her understanding and acceptance.   Dental Advisory Given  Plan Discussed with: CRNA, Surgeon and Anesthesiologist  Anesthesia Plan Comments:         Anesthesia Quick Evaluation  

## 2013-06-02 ENCOUNTER — Encounter (HOSPITAL_COMMUNITY): Payer: Self-pay | Admitting: Obstetrics & Gynecology

## 2013-06-08 NOTE — Anesthesia Postprocedure Evaluation (Signed)
  Anesthesia Post-op Note  Patient: Shari Stewart  Procedure(s) Performed: Procedure(s): BARTHOLIN CYST MARSUPIALIZATION (Right) Patient is awake and responsive. Pain and nausea are reasonably well controlled. Vital signs are stable and clinically acceptable. Oxygen saturation is clinically acceptable. There are no apparent anesthetic complications at this time. Patient is ready for discharge.

## 2015-03-06 DEATH — deceased

## 2016-09-02 ENCOUNTER — Other Ambulatory Visit: Payer: Self-pay | Admitting: Advanced Practice Midwife

## 2016-09-02 ENCOUNTER — Encounter (HOSPITAL_COMMUNITY): Payer: Self-pay

## 2016-09-02 ENCOUNTER — Inpatient Hospital Stay (HOSPITAL_COMMUNITY)
Admission: RE | Admit: 2016-09-02 | Discharge: 2016-09-02 | Disposition: A | Discharge status: Home / Self Care | Payer: Medicaid Other | Source: Ambulatory Visit | Attending: Internal Medicine | Admitting: Internal Medicine

## 2016-09-02 DIAGNOSIS — F17210 Nicotine dependence, cigarettes, uncomplicated: Secondary | ICD-10-CM | POA: Insufficient documentation

## 2016-09-02 DIAGNOSIS — Z3202 Encounter for pregnancy test, result negative: Secondary | ICD-10-CM | POA: Diagnosis not present

## 2016-09-02 DIAGNOSIS — L509 Urticaria, unspecified: Secondary | ICD-10-CM | POA: Diagnosis not present

## 2016-09-02 LAB — URINALYSIS, ROUTINE W REFLEX MICROSCOPIC
Bilirubin Urine: NEGATIVE
Glucose, UA: 250 mg/dL — AB
Ketones, ur: 15 mg/dL — AB
Leukocytes, UA: NEGATIVE
Nitrite: NEGATIVE
Protein, ur: NEGATIVE mg/dL
Specific Gravity, Urine: 1.015 (ref 1.005–1.030)
pH: 6 (ref 5.0–8.0)

## 2016-09-02 LAB — POCT PREGNANCY, URINE: Preg Test, Ur: NEGATIVE

## 2016-09-02 LAB — URINE MICROSCOPIC-ADD ON: RBC / HPF: NONE SEEN RBC/hpf (ref 0–5)

## 2016-09-02 MED ORDER — FAMOTIDINE IN NACL 20-0.9 MG/50ML-% IV SOLN
20.0000 mg | Freq: Once | INTRAVENOUS | Status: AC
  Administered 2016-09-02: 20 mg via INTRAVENOUS
  Filled 2016-09-02: qty 50

## 2016-09-02 MED ORDER — PREDNISONE 20 MG PO TABS: ORAL_TABLET | ORAL | 0 refills | Status: DC

## 2016-09-02 MED ORDER — SODIUM CHLORIDE 0.9 % IV SOLN
INTRAVENOUS | Status: DC
  Administered 2016-09-02: 03:00:00 via INTRAVENOUS

## 2016-09-02 MED ORDER — LORATADINE 10 MG PO TABS: 10.0000 mg | ORAL_TABLET | Freq: Every day | ORAL | 1 refills | Status: DC

## 2016-09-02 MED ORDER — FAMOTIDINE 20 MG PO TABS: 20.0000 mg | ORAL_TABLET | Freq: Two times a day (BID) | ORAL | 3 refills | Status: DC

## 2016-09-02 MED ORDER — DIPHENHYDRAMINE HCL 50 MG/ML IJ SOLN
50.0000 mg | Freq: Once | INTRAMUSCULAR | Status: AC
  Administered 2016-09-02: 50 mg via INTRAVENOUS
  Filled 2016-09-02: qty 1

## 2016-09-02 MED ORDER — METHYLPREDNISOLONE SODIUM SUCC 125 MG IJ SOLR
60.0000 mg | Freq: Once | INTRAMUSCULAR | Status: AC
  Administered 2016-09-02: 60 mg via INTRAVENOUS
  Filled 2016-09-02: qty 2

## 2016-09-02 NOTE — MAU Provider Note (Signed)
Chief Complaint: Rash   First Provider Initiated Contact with Patient 09/02/16 0155     SUBJECTIVE HPI: Shari Stewart is a 40 y.o. female who presents to [facility]missions reporting worsening hives. Developed hives on her face, neck, upper chest and shoulders on 08/30/2016. Symptoms worsened yesterday morning so she went to urgent care. Was given a dose of prednisone and Benadryl and prescribed a prednisone Dosepak. She took a total of 110 mg of prednisone over the past 24 hours with the combination of the dose at urgent care and the initial taper dose. Has been taking Benadryl 50 mg every 4-6 hours. Was initially feeling better yesterday afternoon but hives got much worse around her eyes during the night.    No history of hives. Only known allergies are Betadine and erythromycin, but she states she's not been exposed to either of those lately. Has not tried any new foods, detergents, medications or cosmetics.  Associated signs and symptoms: Positive for itching. Negative for fever, chills, shortness of breath, wheezing, swelling of lips or tongue, nausea, vomiting, diarrhea.  Patient got married yesterday and is wondering if there could be something on her wedding dress that caused the reaction. Is planning to leave for her honeymoon to the [place] later this morning.  Past Medical History:  Diagnosis Date  . Medical history non-contributory    OB History  Gravida Para Term Preterm AB Living  1 1 1     1   SAB TAB Ectopic Multiple Live Births               # Outcome Date GA Lbr Len/2nd Weight Sex Delivery Anes PTL Lv  1 Term 02/24/09    Berenice Bouton        Past Surgical History:  Procedure Laterality Date  . BARTHOLIN CYST MARSUPIALIZATION Right 06/01/2013   Procedure: BARTHOLIN CYST MARSUPIALIZATION;  Surgeon: Princess Bruins, MD;  Location: [facility];  Service: Gynecology;  Laterality: Right;  . CERVICAL BIOPSY  W/ LOOP ELECTRODE EXCISION  1991  . CESAREAN SECTION      Social History   Social History  . Marital status: Single    Spouse name: N/A  . Number of children: N/A  . Years of education: N/A   Occupational History  . Not on file.   Social History Main Topics  . Smoking status: Current Every Day Smoker    Packs/day: 0.50    Types: Cigarettes  . Smokeless tobacco: Never Used  . Alcohol use 1.2 oz/week    2 Glasses of wine per week  . Drug use: No  . Sexual activity: Yes    Birth control/ protection: None   Other Topics Concern  . Not on file   Social History Narrative  . No narrative on file   History reviewed. No pertinent family history. Medications Prior to Admission  Medication Sig Dispense Refill  . amphetamine-dextroamphetamine (ADDERALL XR) 30 MG 24 hr capsule Take 30 mg by mouth daily.    Marland Kitchen amphetamine-dextroamphetamine (ADDERALL) 15 MG tablet Take 15 mg by mouth 2 (two) times daily.    . busPIRone (BUSPAR) 15 MG tablet Take 15 mg by mouth 2 (two) times daily.    . cetirizine (ZYRTEC) 10 MG tablet Take 10 mg by mouth daily.    . cloNIDine HCl (KAPVAY) 0.1 MG TB12 ER tablet Take by mouth at bedtime.    Marland Kitchen ibuprofen (ADVIL,MOTRIN) 200 MG tablet Take 400-600 mg by mouth every 8 (eight) hours as needed. For pain    .  valACYclovir (VALTREX) 500 MG tablet Take 500 mg by mouth daily as needed (for cold sores).    Marland Kitchen amphetamine-dextroamphetamine (ADDERALL) 10 MG tablet Take 10 mg by mouth daily.    Marland Kitchen etonogestrel (NEXPLANON) 68 MG IMPL implant Inject 1 each into the skin once.    . hydrocodone-ibuprofen (VICOPROFEN) 5-200 MG per tablet Take 1 tablet by mouth every 8 (eight) hours as needed for pain. 10 tablet 0  . lisdexamfetamine (VYVANSE) 70 MG capsule Take 70 mg by mouth daily.    . Vitamin D, Ergocalciferol, (DRISDOL) 50000 UNITS CAPS Take 50,000 Units by mouth every 7 (seven) days. thursdays      Allergies  Allergen Reactions  . Bee Pollen Swelling  . Betadine [Povidone Iodine]     BLISTERS  . Erythromycin Hives and  Nausea And Vomiting    I have reviewed patient's Past Medical Hx, Surgical Hx, Family Hx, Social Hx, medications and allergies.   Review of Systems  Constitutional: Negative for chills and fever.  HENT: Positive for facial swelling. Negative for trouble swallowing.   Respiratory: Negative for chest tightness, shortness of breath and wheezing.   Cardiovascular: Negative for chest pain.  Gastrointestinal: Negative for diarrhea, nausea and vomiting.  Allergic/Immunologic: Negative for food allergies.    OBJECTIVE Patient Vitals for the past 24 hrs:  BP Temp Temp src Pulse Resp SpO2 Weight  09/02/16 0204 - - - - - - 141 lb 2 oz (64 kg)  09/02/16 0150 125/85 98.7 F (37.1 C) Oral 100 18 100 % -   Constitutional: Well-developed, well-nourished female in no acute distress.  HEENT: Entire face and neck covered in hives. No swelling of lips or tongue. Normal speech. Cardiovascular: normal rate and rhythm. No murmurs rubs or gallops. Respiratory: normal rate and effort. Clear to auscultation bilaterally. MS: Few scattered hives on bilateral shoulders and upper chest. Upper chest erythematous with excoriation present Neurologic: Alert and oriented x 4.   LAB RESULTS Results for orders placed or performed during the hospital encounter of 09/02/16 (from the past 24 hour(s))  Urinalysis, Routine w reflex microscopic (not at [facility])     Status: Abnormal   Collection Time: 09/02/16  1:40 AM  Result Value Ref Range   Color, Urine YELLOW YELLOW   APPearance CLEAR CLEAR   Specific Gravity, Urine 1.015 1.005 - 1.030   pH 6.0 5.0 - 8.0   Glucose, UA 250 (A) NEGATIVE mg/dL   Hgb urine dipstick TRACE (A) NEGATIVE   Bilirubin Urine NEGATIVE NEGATIVE   Ketones, ur 15 (A) NEGATIVE mg/dL   Protein, ur NEGATIVE NEGATIVE mg/dL   Nitrite NEGATIVE NEGATIVE   Leukocytes, UA NEGATIVE NEGATIVE  Urine microscopic-add on     Status: Abnormal   Collection Time: 09/02/16  1:40 AM  Result Value Ref Range    Squamous Epithelial / LPF 0-5 (A) NONE SEEN   WBC, UA 0-5 0 - 5 WBC/hpf   RBC / HPF NONE SEEN 0 - 5 RBC/hpf   Bacteria, UA RARE (A) NONE SEEN  Pregnancy, urine POC     Status: None   Collection Time: 09/02/16  2:00 AM  Result Value Ref Range   Preg Test, Ur NEGATIVE NEGATIVE    IMAGING No results found.  MAU COURSE Orders Placed This Encounter  Procedures  . Urinalysis, Routine w reflex microscopic (not at [facility])  . Urine microscopic-add on  . Pregnancy, urine POC  . Insert peripheral IV   Meds ordered this encounter  Medications  . methylPREDNISolone  sodium succinate (SOLU-MEDROL) 125 mg/2 mL injection 60 mg  . diphenhydrAMINE (BENADRYL) injection 50 mg  . 0.9 %  sodium chloride infusion  . famotidine (PEPCID) IVPB 20 mg premix   Consulted with Dr. Betsey Holiday at [facility] emergency department. Discussed history, exam, previous doses of medications. Recommends IV Solu-Medrol, Benadryl and Pepcid in MAU and increasing dose and duration of prednisone taper to take after discharge.  Hives and swelling of face and particularly periorbital area significantly improved. Patient requesting discharge.  MDM - Hives of unknown etiology. No evidence of anaphylaxis.  ASSESSMENT 1. Hives     PLAN Discharge home in stable condition. Anaphylaxis precautions Encouraged patient to avoid travel if symptoms worsen. Rx prednisone Dosepak, Pepcid, Claritin. Continue Benadryl. Follow-up Information    [facility] .   Specialty:  Emergency Medicine Why:  as needed if symptoms worsen Contact information: [id number] [place] [phone]           Medication List    STOP taking these medications   cetirizine 10 MG tablet Commonly known as:  ZYRTEC   hydrocodone-ibuprofen 5-200 MG tablet Commonly known as:  VICOPROFEN   lisdexamfetamine 70 MG capsule Commonly known as:  VYVANSE   NEXPLANON  68 MG Impl implant Generic drug:  etonogestrel   Vitamin D (Ergocalciferol) 50000 units Caps capsule Commonly known as:  DRISDOL     TAKE these medications   amphetamine-dextroamphetamine 30 MG 24 hr capsule Commonly known as:  ADDERALL XR Take 30 mg by mouth daily. What changed:  Another medication with the same name was removed. Continue taking this medication, and follow the directions you see here.   amphetamine-dextroamphetamine 15 MG tablet Commonly known as:  ADDERALL Take 15 mg by mouth 2 (two) times daily. What changed:  Another medication with the same name was removed. Continue taking this medication, and follow the directions you see here.   busPIRone 15 MG tablet Commonly known as:  BUSPAR Take 15 mg by mouth 2 (two) times daily.   cloNIDine HCl 0.1 MG Tb12 ER tablet Commonly known as:  KAPVAY Take by mouth at bedtime.   famotidine 20 MG tablet Commonly known as:  PEPCID Take 1 tablet (20 mg total) by mouth 2 (two) times daily.   ibuprofen 200 MG tablet Commonly known as:  ADVIL,MOTRIN Take 400-600 mg by mouth every 8 (eight) hours as needed. For pain   loratadine 10 MG tablet Commonly known as:  CLARITIN Take 1 tablet (10 mg total) by mouth daily.   predniSONE 20 MG tablet Commonly known as:  DELTASONE 60 mg (3 tablets) for 3 days 40 mg (2 tablets) for 3 days 20 mg (1 tablet) for 3 days 10 mg (1/2 tablet) for 3 days   valACYclovir 500 MG tablet Commonly known as:  VALTREX Take 500 mg by mouth daily as needed (for cold sores).        [place], Section 09/02/2016  3:58 AM

## 2016-09-02 NOTE — MAU Note (Signed)
Went to tanning bed on Monday or Tuesday, face was red but went away.  Thursday, put on a face mask and started to burn and rinsed off and got better.  Later in night, got red and worse then got better.  Wore wedding dress today and it rested against chest and face. Then today, took nap and woke up and eyes were swollen shut.

## 2016-09-02 NOTE — Discharge Instructions (Signed)
Hives Hives are itchy, red, swollen areas of the skin. They can vary in size and location on your body. Hives can come and go for hours or several days (acute hives) or for several weeks (chronic hives). Hives do not spread from person to person (noncontagious). They may get worse with scratching, exercise, and emotional stress. CAUSES   Allergic reaction to food, additives, or drugs.  Infections, including the common cold.  Illness, such as vasculitis, lupus, or thyroid disease.  Exposure to sunlight, heat, or cold.  Exercise.  Stress.  Contact with chemicals. SYMPTOMS   Red or white swollen patches on the skin. The patches may change size, shape, and location quickly and repeatedly.  Itching.  Swelling of the hands, feet, and face. This may occur if hives develop deeper in the skin. DIAGNOSIS  Your caregiver can usually tell what is wrong by performing a physical exam. Skin or blood tests may also be done to determine the cause of your hives. In some cases, the cause cannot be determined. TREATMENT  Mild cases usually get better with medicines such as antihistamines. Severe cases may require an emergency epinephrine injection. If the cause of your hives is known, treatment includes avoiding that trigger.  HOME CARE INSTRUCTIONS   Avoid causes that trigger your hives.  Take antihistamines as directed by your caregiver to reduce the severity of your hives. Non-sedating or low-sedating antihistamines are usually recommended. Do not drive while taking an antihistamine.  Take any other medicines prescribed for itching as directed by your caregiver.  Wear loose-fitting clothing.  Keep all follow-up appointments as directed by your caregiver. SEEK MEDICAL CARE IF:   You have persistent or severe itching that is not relieved with medicine.  You have painful or swollen joints. SEEK IMMEDIATE MEDICAL CARE IF:   You have a fever.  Your tongue or lips are swollen.  You have  trouble breathing or swallowing.  You feel tightness in the throat or chest.  You have abdominal pain. These problems may be the first sign of a life-threatening allergic reaction. Call your local emergency services (911 in [place] MAKE SURE YOU:   Understand these instructions.  Will watch your condition.  Will get help right away if you are not doing well or get worse.   This information is not intended to replace advice given to you by your health care provider. Make sure you discuss any questions you have with your health care provider.   Document Released: 10/22/2005 Document Revised: 10/27/2013 Document Reviewed: 01/15/2012 Elsevier Interactive Patient Education 2016 [employer] Inc[employer]  

## 2016-09-05 ENCOUNTER — Telehealth (HOSPITAL_COMMUNITY): Payer: Self-pay

## 2016-09-23 ENCOUNTER — Emergency Department (HOSPITAL_COMMUNITY)
Admission: AD | Admit: 2016-09-23 | Discharge: 2016-09-23 | Disposition: A | Discharge status: Home / Self Care | Payer: Medicaid Other | Source: Ambulatory Visit | Attending: Emergency Medicine | Admitting: Emergency Medicine

## 2016-09-23 ENCOUNTER — Encounter (HOSPITAL_COMMUNITY): Payer: Self-pay | Admitting: *Deleted

## 2016-09-23 DIAGNOSIS — F17210 Nicotine dependence, cigarettes, uncomplicated: Secondary | ICD-10-CM | POA: Insufficient documentation

## 2016-09-23 DIAGNOSIS — T7840XA Allergy, unspecified, initial encounter: Secondary | ICD-10-CM | POA: Insufficient documentation

## 2016-09-23 DIAGNOSIS — T7840XD Allergy, unspecified, subsequent encounter: Secondary | ICD-10-CM

## 2016-09-23 LAB — CBC WITH DIFFERENTIAL/PLATELET
Basophils Absolute: 0 10*3/uL (ref 0.0–0.1)
Basophils Relative: 0 %
Eosinophils Absolute: 0.2 10*3/uL (ref 0.0–0.7)
Eosinophils Relative: 3 %
HCT: 39.7 % (ref 36.0–46.0)
Hemoglobin: 13.3 g/dL (ref 12.0–15.0)
Lymphocytes Relative: 32 %
Lymphs Abs: 2.2 10*3/uL (ref 0.7–4.0)
MCH: 31.6 pg (ref 26.0–34.0)
MCHC: 33.5 g/dL (ref 30.0–36.0)
MCV: 94.3 fL (ref 78.0–100.0)
Monocytes Absolute: 0.4 10*3/uL (ref 0.1–1.0)
Monocytes Relative: 6 %
Neutro Abs: 4.1 10*3/uL (ref 1.7–7.7)
Neutrophils Relative %: 59 %
Platelets: 180 10*3/uL (ref 150–400)
RBC: 4.21 MIL/uL (ref 3.87–5.11)
RDW: 12.8 % (ref 11.5–15.5)
WBC: 6.9 10*3/uL (ref 4.0–10.5)

## 2016-09-23 LAB — BASIC METABOLIC PANEL
Anion gap: 6 (ref 5–15)
BUN: 13 mg/dL (ref 6–20)
CO2: 25 mmol/L (ref 22–32)
Calcium: 9.1 mg/dL (ref 8.9–10.3)
Chloride: 106 mmol/L (ref 101–111)
Creatinine, Ser: 0.93 mg/dL (ref 0.44–1.00)
GFR calc Af Amer: >60 mL/min (ref >60)
GFR calc non Af Amer: >60 mL/min (ref >60)
Glucose, Bld: 94 mg/dL (ref 65–99)
Potassium: 3.9 mmol/L (ref 3.5–5.1)
Sodium: 137 mmol/L (ref 135–145)

## 2016-09-23 MED ORDER — METHYLPREDNISOLONE SODIUM SUCC 125 MG IJ SOLR
125.0000 mg | Freq: Once | INTRAMUSCULAR | Status: AC
  Administered 2016-09-23: 125 mg via INTRAVENOUS
  Filled 2016-09-23: qty 2

## 2016-09-23 MED ORDER — FAMOTIDINE IN NACL 20-0.9 MG/50ML-% IV SOLN
20.0000 mg | Freq: Once | INTRAVENOUS | Status: AC
  Administered 2016-09-23: 20 mg via INTRAVENOUS
  Filled 2016-09-23: qty 50

## 2016-09-23 MED ORDER — PREDNISONE 10 MG (21) PO TBPK: 10.0000 mg | ORAL_TABLET | Freq: Every day | ORAL | 0 refills | Status: DC

## 2016-09-23 MED ORDER — HYDROXYZINE HCL 25 MG PO TABS: 25.0000 mg | ORAL_TABLET | Freq: Four times a day (QID) | ORAL | 0 refills | Status: DC

## 2016-09-23 MED ORDER — DIPHENHYDRAMINE HCL 50 MG/ML IJ SOLN
25.0000 mg | Freq: Once | INTRAMUSCULAR | Status: AC
  Administered 2016-09-23: 25 mg via INTRAVENOUS
  Filled 2016-09-23: qty 1

## 2016-09-23 MED ORDER — FAMOTIDINE 20 MG PO TABS: 20.0000 mg | ORAL_TABLET | Freq: Two times a day (BID) | ORAL | 0 refills | Status: DC

## 2016-09-23 MED ORDER — HYDROXYZINE HCL 25 MG PO TABS
25.0000 mg | ORAL_TABLET | Freq: Once | ORAL | Status: AC
  Administered 2016-09-23: 25 mg via ORAL
  Filled 2016-09-23: qty 1

## 2016-09-23 NOTE — MAU Provider Note (Signed)
History     CSN: [id number]  Arrival date and time: 09/23/16 7078   First Provider Initiated Contact with Patient 09/23/16 0048      Chief Complaint  Patient presents with  . Allergic Reaction   HPI   Ms.Shari Stewart is a 40 y.o. female G2P1001 here in MAU with possible allergic reaction.  She was seen on 10/29 for this same complaint and was given steroids which helped. The cause of this reaction was never identified.  The rash and symptoms went away following treatment.  She attributed the symptoms on 10/29 to the fabric on her wedding dress that she ordered from [place]. She had to pull the dress over her face multiple times and thought this caused her to "break out".  She had welts and a rash localized to her chest.   Today she states she is having a hard time making her words out and states " I don't feel normal". She noticed a rash develop on her face and chest; she denies facial swelling of her face, throat or, eyes.  She denies difficulty breathing or catching her breath.  Only known allergies are Betadine and erythromycin and bees.  At 1015 tonight she took 50 mg of benadryl and 2 zyrtec; she reports minor improvement.   OB History    Gravida Para Term Preterm AB Living   _0 SAB TAB Ectopic Multiple Live Births                  Past Medical History:  Diagnosis Date  . Medical history non-contributory     Past Surgical History:  Procedure Laterality Date  . BARTHOLIN CYST MARSUPIALIZATION Right 06/01/2013   Procedure: BARTHOLIN CYST MARSUPIALIZATION;  Surgeon: Princess Bruins, MD;  Location: [facility];  Service: Gynecology;  Laterality: Right;  . CERVICAL BIOPSY  W/ LOOP ELECTRODE EXCISION  1991  . CESAREAN SECTION      History reviewed. No pertinent family history.  Social History  Substance Use Topics  . Smoking status: Current Every Day Smoker    Packs/day: 0.50    Types: Cigarettes  . Smokeless tobacco: Never Used  . Alcohol use 1.2  oz/week    2 Glasses of wine per week    Allergies:  Allergies  Allergen Reactions  . Bee Pollen Swelling  . Betadine [Povidone Iodine]     BLISTERS  . Erythromycin Hives and Nausea And Vomiting  . Shellfish Allergy     Prescriptions Prior to Admission  Medication Sig Dispense Refill Last Dose  . cetirizine (ZYRTEC) 10 MG tablet Take 10 mg by mouth daily.   09/23/2016 at Unknown time  . diphenhydrAMINE (BENADRYL) 25 mg capsule Take 25 mg by mouth every 6 (six) hours as needed.   09/23/2016 at Unknown time  . amphetamine-dextroamphetamine (ADDERALL XR) 30 MG 24 hr capsule Take 30 mg by mouth daily.   09/01/2016 at Unknown time  . amphetamine-dextroamphetamine (ADDERALL) 15 MG tablet Take 15 mg by mouth 2 (two) times daily.   09/01/2016 at Unknown time  . busPIRone (BUSPAR) 15 MG tablet Take 15 mg by mouth 2 (two) times daily.   09/01/2016 at Unknown time  . cloNIDine HCl (KAPVAY) 0.1 MG TB12 ER tablet Take by mouth at bedtime.   09/01/2016 at Unknown time  . famotidine (PEPCID) 20 MG tablet TAKE 1 TABLET BY MOUTH TWO TIMES DAILY 180 tablet 3   . ibuprofen (ADVIL,MOTRIN) 200 MG tablet Take 400-600  mg by mouth every 8 (eight) hours as needed. For pain   09/01/2016 at Unknown time  . loratadine (CLARITIN) 10 MG tablet Take 1 tablet (10 mg total) by mouth daily. 20 tablet 1   . predniSONE (DELTASONE) 20 MG tablet 60 mg (3 tablets) for 3 days 40 mg (2 tablets) for 3 days 20 mg (1 tablet) for 3 days 10 mg (1/2 tablet) for 3 days 20 tablet 0   . valACYclovir (VALTREX) 500 MG tablet Take 500 mg by mouth daily as needed (for cold sores).   09/01/2016 at Unknown time   Results for orders placed or performed during the hospital encounter of 09/23/16 (from the past 48 hour(s))  CBC with Differential/Platelet     Status: None   Collection Time: 09/23/16  3:06 AM  Result Value Ref Range   WBC 6.9 4.0 - 10.5 K/uL   RBC 4.21 3.87 - 5.11 MIL/uL   Hemoglobin 13.3 12.0 - 15.0 g/dL   HCT 39.7 36.0 -  46.0 %   MCV 94.3 78.0 - 100.0 fL   MCH 31.6 26.0 - 34.0 pg   MCHC 33.5 30.0 - 36.0 g/dL   RDW 12.8 11.5 - 15.5 %   Platelets 180 150 - 400 K/uL   Neutrophils Relative % 59 %   Neutro Abs 4.1 1.7 - 7.7 K/uL   Lymphocytes Relative 32 %   Lymphs Abs 2.2 0.7 - 4.0 K/uL   Monocytes Relative 6 %   Monocytes Absolute 0.4 0.1 - 1.0 K/uL   Eosinophils Relative 3 %   Eosinophils Absolute 0.2 0.0 - 0.7 K/uL   Basophils Relative 0 %   Basophils Absolute 0.0 0.0 - 0.1 K/uL  Basic metabolic panel     Status: None   Collection Time: 09/23/16  3:06 AM  Result Value Ref Range   Sodium 137 135 - 145 mmol/L   Potassium 3.9 3.5 - 5.1 mmol/L   Chloride 106 101 - 111 mmol/L   CO2 25 22 - 32 mmol/L   Glucose, Bld 94 65 - 99 mg/dL   BUN 13 6 - 20 mg/dL   Creatinine, Ser 0.93 0.44 - 1.00 mg/dL   Calcium 9.1 8.9 - 10.3 mg/dL   GFR calc non Af Amer >60 >60 mL/min   GFR calc Af Amer >60 >60 mL/min    Comment: (NOTE) The eGFR has been calculated using the CKD EPI equation. This calculation has not been validated in all clinical situations. eGFR's persistently <60 mL/min signify possible Chronic Kidney Disease.    Anion gap 6 5 - 15    Review of Systems  Neurological: Positive for dizziness and speech change (Slurry speach). Negative for tingling, seizures, loss of consciousness and headaches.   Physical Exam   Blood pressure 128/84, pulse 76, resp. rate 18, height 5' 6" (1.676 m), last menstrual period 09/09/2016, SpO2 100 %.  Physical Exam  Constitutional: She is oriented to person, place, and time.  HENT:  Head: Normocephalic.  Neck: Trachea normal and normal range of motion.  Cardiovascular: Regular rhythm and normal heart sounds.   Respiratory: Effort normal and breath sounds normal. No accessory muscle usage. No tachypnea and no bradypnea. No respiratory distress. She exhibits no tenderness and no swelling.  Neurological: She is alert and oriented to person, place, and time. She has  normal strength. She is not disoriented. No sensory deficit. GCS eye subscore is 4. GCS verbal subscore is 5. GCS motor subscore is 6.  Skin: Skin is warm.  Rash (Red, patchy, non-raised rash to neck and face. ) noted.  Psychiatric: Her affect is not inappropriate. Cognition and memory are not impaired. She does not express inappropriate judgment.    MAU Course  Procedures  None  MDM  Spoke to [facility] ED; patient to go via private vehicle per patient requests to [facility].   Patient 100% on RA, without swelling to face or tongue.  Neuro exam WNL Assessment and Plan    A:  1. Allergic reaction, subsequent encounter   2. Allergic reaction, initial encounter     P:  Patient discharged from MAU. Patient planning to go to [facility] per private vehicle.  If symptoms worsen in route patient to call [place], NP 09/24/2016 6:43 PM

## 2016-09-23 NOTE — MAU Note (Signed)
Started with itching and red rash chest, face, neck, upper back and shoulders. Areas are red and itchy. Pt was here  3wks ago with similar problem. Pt had just gotten married and thought is was from material of wedding dress. States tonight is having trouble putting together words.

## 2016-09-23 NOTE — ED Notes (Signed)
Nurse will start IV and get blood. 

## 2016-09-23 NOTE — MAU Note (Signed)
Report called to [place] at [facility] ED.

## 2016-09-23 NOTE — ED Provider Notes (Signed)
[facility] DEPT Provider Note   CSN: [id number] Arrival date & time: 09/23/16  0008  By signing my name below, I, Irene Pap, attest that this documentation has been prepared under the direction and in the presence of Ezequiel Essex, MD. Electronically Signed: Irene Pap, ED Scribe. 09/23/16. 2:16 AM.  History   Chief Complaint Chief Complaint  Patient presents with  . Allergic Reaction   The history is provided by the patient. No language interpreter was used.   HPI Comments: Shari Stewart is a 40 y.o. female who presents to the Emergency Department complaining of gradually worsening, red, itching rash onset one day ago. Pt presents from [facility]. She has a rash to the neck, chest, and face. Pt reports hx of similar rash 3 weeks ago after wearing her wedding dress, but says that her current rash is more severe. Pt finished a course of steroids after her rash 3 weeks ago. Pt [place] ago, but did not experience rash after returning. Pt has eaten meat in the past week. Pt has tried Benadryl and Zyrtec. She denies recent medication changes, bug bites, new foods, new detergents or soaps. She denies sick contacts, facial swelling, oropharyngeal swelling, fever, chills, trouble swallowing, SOB, chest pain, nausea, or vomiting. Pt does not have any known allergens. Pt is a smoker.   Past Medical History:  Diagnosis Date  . Medical history non-contributory     There are no active problems to display for this patient.   Past Surgical History:  Procedure Laterality Date  . BARTHOLIN CYST MARSUPIALIZATION Right 06/01/2013   Procedure: BARTHOLIN CYST MARSUPIALIZATION;  Surgeon: Princess Bruins, MD;  Location: [facility];  Service: Gynecology;  Laterality: Right;  . CERVICAL BIOPSY  W/ LOOP ELECTRODE EXCISION  1991  . CESAREAN SECTION      OB History    Gravida Para Term Preterm AB Living   2 1 1     1    SAB TAB Ectopic Multiple Live  Births                   Home Medications    Prior to Admission medications   Medication Sig Start Date End Date Taking? Authorizing Provider  cetirizine (ZYRTEC) 10 MG tablet Take 10 mg by mouth daily.   Yes Historical Provider, MD  diphenhydrAMINE (BENADRYL) 25 mg capsule Take 25 mg by mouth every 6 (six) hours as needed.   Yes Historical Provider, MD  amphetamine-dextroamphetamine (ADDERALL XR) 30 MG 24 hr capsule Take 30 mg by mouth daily.    Historical Provider, MD  amphetamine-dextroamphetamine (ADDERALL) 15 MG tablet Take 15 mg by mouth 2 (two) times daily.    Historical Provider, MD  busPIRone (BUSPAR) 15 MG tablet Take 15 mg by mouth 2 (two) times daily.    Historical Provider, MD  cloNIDine HCl (KAPVAY) 0.1 MG TB12 ER tablet Take by mouth at bedtime.    Historical Provider, MD  famotidine (PEPCID) 20 MG tablet TAKE 1 TABLET BY MOUTH TWO TIMES DAILY 09/02/16   Manya Silvas, CNM  ibuprofen (ADVIL,MOTRIN) 200 MG tablet Take 400-600 mg by mouth every 8 (eight) hours as needed. For pain    Historical Provider, MD  loratadine (CLARITIN) 10 MG tablet Take 1 tablet (10 mg total) by mouth daily. 09/02/16   Manya Silvas, CNM  predniSONE (DELTASONE) 20 MG tablet 60 mg (3 tablets) for 3 days 40 mg (2 tablets) for 3 days 20 mg (1 tablet) for 3  days 10 mg (1/2 tablet) for 3 days 09/02/16   Manya Silvas, CNM  valACYclovir (VALTREX) 500 MG tablet Take 500 mg by mouth daily as needed (for cold sores).    Historical Provider, MD    Family History History reviewed. No pertinent family history.  Social History Social History  Substance Use Topics  . Smoking status: Current Every Day Smoker    Packs/day: 0.50    Types: Cigarettes  . Smokeless tobacco: Never Used  . Alcohol use 1.2 oz/week    2 Glasses of wine per week     Allergies   Bee pollen; Betadine [povidone iodine]; Erythromycin; and Shellfish allergy   Review of Systems Review of Systems  Constitutional: Negative for  chills and fever.  HENT: Negative for facial swelling and trouble swallowing.   Respiratory: Negative for shortness of breath.   Cardiovascular: Negative for chest pain.  Gastrointestinal: Negative for nausea and vomiting.  Skin: Positive for rash.  All other systems reviewed and are negative.  Physical Exam Updated Vital Signs BP 128/84 (BP Location: Right Arm)   Pulse 76   Resp 18   Ht 5\' 6"  (1.676 m)   LMP 09/09/2016   SpO2 100%   Physical Exam  Constitutional: She is oriented to person, place, and time. She appears well-developed and well-nourished. No distress.  HENT:  Head: Normocephalic and atraumatic.  Mouth/Throat: Oropharynx is clear and moist. No oropharyngeal exudate.  No facial or tongue swelling  Eyes: Conjunctivae and EOM are normal. Pupils are equal, round, and reactive to light.  Neck: Normal range of motion. Neck supple.  No meningismus.  Cardiovascular: Normal rate, regular rhythm, normal heart sounds and intact distal pulses.   No murmur heard. Pulmonary/Chest: Effort normal and breath sounds normal. No respiratory distress. She has no wheezes.  Speaking in full sentences  Abdominal: Soft. There is no tenderness. There is no rebound and no guarding.  Musculoskeletal: Normal range of motion. She exhibits no edema or tenderness.  Neurological: She is alert and oriented to person, place, and time. No cranial nerve deficit. She exhibits normal muscle tone. Coordination normal.   5/5 strength throughout. CN 2-12 intact.Equal grip strength.   Skin: Skin is warm. Rash noted. Rash is papular.  Diffuse, erythematous papular rash to the face, neck, and upper chest with excoriations  Psychiatric: She has a normal mood and affect. Her behavior is normal.  Nursing note and vitals reviewed.  ED Treatments / Results  DIAGNOSTIC STUDIES: Oxygen Saturation is 100% on RA, normal by my interpretation.    COORDINATION OF CARE: 2:21 AM-Discussed treatment plan which  includes pepcid, benadryl, and steroids with pt at bedside and pt agreed to plan.    Labs (all labs ordered are listed, but only abnormal results are displayed) Labs Reviewed  CBC WITH DIFFERENTIAL/PLATELET  BASIC METABOLIC PANEL    EKG  EKG Interpretation None       Radiology No results found.  Procedures Procedures (including critical care time)  Medications Ordered in ED Medications - No data to display   Initial Impression / Assessment and Plan / ED Course  I have reviewed the triage vital signs and the nursing notes.  Pertinent labs & imaging results that were available during my care of the patient were reviewed by me and considered in my medical decision making (see chart for details).  Clinical Course   Patient presents with itchy red rash to face, neck and chest onset 2 days ago. Similar rash several weeks ago  attributed to wedding dress. Denies any new exposures. Denies any difficulty breathing or swallowing.  No tongue or lip swelling. She is not wheezing. No airway involvement.   Patient will be given IV antihistamines and steroids, IV fluids.   Rash appears to be consistent with contact dermatitis to face, neck, and chest. There is no tongue or lip swelling. There is no wheezing. Patient denies any new exposures or contacts.  Will plan prednisone taper, antihistamines, follow-up with PCP. Final Clinical Impressions(s) / ED Diagnoses   Final diagnoses:  Allergic reaction, subsequent encounter  I personally performed the services described in this documentation, which was scribed in my presence. The recorded information has been reviewed and is accurate.  New Prescriptions Current Discharge Medication List       Ezequiel Essex, MD 09/24/16 [phone]

## 2016-09-23 NOTE — Discharge Instructions (Signed)
Take the steroids and antihistamines as prescribed. Followup with your doctor. Return to the ED if you develop new or worsening symptoms.

## 2016-09-23 NOTE — Progress Notes (Signed)
Noni Saupe NP in earlier to discuss plan of care with pt and pt's spouse.

## 2017-01-08 NOTE — Telephone Encounter (Signed)
See "Contacts" 

## 2017-12-07 ENCOUNTER — Other Ambulatory Visit: Payer: Self-pay

## 2017-12-07 ENCOUNTER — Other Ambulatory Visit: Payer: Self-pay | Admitting: Advanced Practice Midwife

## 2017-12-07 ENCOUNTER — Emergency Department (HOSPITAL_COMMUNITY)
Admission: EM | Admit: 2017-12-07 | Discharge: 2017-12-07 | Disposition: A | Discharge status: Home / Self Care | Payer: Medicaid Other | Attending: Emergency Medicine | Admitting: Emergency Medicine

## 2017-12-07 ENCOUNTER — Encounter (HOSPITAL_COMMUNITY): Payer: Self-pay | Admitting: Emergency Medicine

## 2017-12-07 DIAGNOSIS — Z79899 Other long term (current) drug therapy: Secondary | ICD-10-CM | POA: Diagnosis not present

## 2017-12-07 DIAGNOSIS — T7840XA Allergy, unspecified, initial encounter: Secondary | ICD-10-CM | POA: Diagnosis not present

## 2017-12-07 DIAGNOSIS — F17210 Nicotine dependence, cigarettes, uncomplicated: Secondary | ICD-10-CM | POA: Insufficient documentation

## 2017-12-07 MED ORDER — PREDNISONE 20 MG PO TABS
60.0000 mg | ORAL_TABLET | Freq: Once | ORAL | Status: AC
Start: 2017-12-07 — End: 2017-12-07
  Administered 2017-12-07: 60 mg via ORAL
  Filled 2017-12-07: qty 3

## 2017-12-07 MED ORDER — HYDROXYZINE HCL 25 MG PO TABS
25.0000 mg | ORAL_TABLET | Freq: Once | ORAL | Status: AC
  Administered 2017-12-07: 25 mg via ORAL
  Filled 2017-12-07: qty 1

## 2017-12-07 MED ORDER — EPINEPHRINE 0.3 MG/0.3ML IJ SOAJ: 0.3000 mg | Freq: Once | INTRAMUSCULAR | 0 refills | Status: AC

## 2017-12-07 MED ORDER — FAMOTIDINE 20 MG PO TABS
20.0000 mg | ORAL_TABLET | Freq: Once | ORAL | Status: AC
Start: 2017-12-07 — End: 2017-12-07
  Administered 2017-12-07: 20 mg via ORAL
  Filled 2017-12-07: qty 1

## 2017-12-07 MED ORDER — DIPHENHYDRAMINE HCL 25 MG PO CAPS
50.0000 mg | ORAL_CAPSULE | Freq: Once | ORAL | Status: AC
  Administered 2017-12-07: 50 mg via ORAL
  Filled 2017-12-07: qty 2

## 2017-12-07 MED ORDER — PREDNISONE 20 MG PO TABS: 60.0000 mg | ORAL_TABLET | Freq: Every day | ORAL | 0 refills | Status: DC

## 2017-12-07 NOTE — ED Provider Notes (Signed)
[facility] EMERGENCY DEPARTMENT Provider Note   CSN: [id number] Arrival date & time: 12/07/17  0118     History   Chief Complaint Chief Complaint  Patient presents with  . Allergic Reaction    HPI Shari Stewart is a 42 y.o. female.  HPI  This is a 42 year old female who presents with worsening allergic reaction.  Patient reports that she has had waxing and waning symptoms of allergic reaction since October.  She reports this correlates with the replacement of a pipe at work.  She thinks it may have something to do with the dust.  Her symptoms seem to get worse when she goes to work and get better when she comes home.  However in the last 3 days she has had worsening facial redness, swelling.  She is taking Benadryl, Pepcid at home with minimal relief.  She states that she felt like there was a change in her voice which concerned her.  She states she has had symptoms like this on and off for over 2 years.  She has never seen an allergist.  She does report being hospitalized.  She has shortness of breath, nausea, vomiting at this time.  Past Medical History:  Diagnosis Date  . Medical history non-contributory     There are no active problems to display for this patient.   Past Surgical History:  Procedure Laterality Date  . BARTHOLIN CYST MARSUPIALIZATION Right 06/01/2013   Procedure: BARTHOLIN CYST MARSUPIALIZATION;  Surgeon: Princess Bruins, MD;  Location: [facility];  Service: Gynecology;  Laterality: Right;  . CERVICAL BIOPSY  W/ LOOP ELECTRODE EXCISION  1991  . CESAREAN SECTION      OB History    Gravida Para Term Preterm AB Living   2 1 1     1    SAB TAB Ectopic Multiple Live Births                   Home Medications    Prior to Admission medications   Medication Sig Start Date End Date Taking? Authorizing Provider  amphetamine-dextroamphetamine (ADDERALL XR) 30 MG 24 hr capsule Take 30 mg by mouth daily.    [provider]    amphetamine-dextroamphetamine (ADDERALL) 15 MG tablet Take 15 mg by mouth 2 (two) times daily.    [provider]  busPIRone (BUSPAR) 15 MG tablet Take 15 mg by mouth 2 (two) times daily.    [provider]  cetirizine (ZYRTEC) 10 MG tablet Take 10 mg by mouth daily.    [provider]  cloNIDine HCl (KAPVAY) 0.1 MG TB12 ER tablet Take by mouth at bedtime.    [provider]  diphenhydrAMINE (BENADRYL) 25 mg capsule Take 25 mg by mouth every 6 (six) hours as needed.    [provider]  EPINEPHrine 0.3 mg/0.3 mL IJ SOAJ injection Inject 0.3 mLs (0.3 mg total) into the muscle once for 1 dose. 12/07/17 12/07/17  Merryl Hacker, MD  famotidine (PEPCID) 20 MG tablet TAKE 1 TABLET BY MOUTH TWO TIMES DAILY Patient not taking: Reported on 09/23/2016 09/02/16   Tamala Julian, [place], CNM  famotidine (PEPCID) 20 MG tablet Take 1 tablet (20 mg total) by mouth 2 (two) times daily. 09/23/16   Rancour, Annie Main, MD  hydrOXYzine (ATARAX/VISTARIL) 25 MG tablet Take 1 tablet (25 mg total) by mouth every 6 (six) hours. 09/23/16   Rancour, Annie Main, MD  ibuprofen (ADVIL,MOTRIN) 200 MG tablet Take 400-600 mg by mouth every 8 (eight) hours as  needed. For pain    [provider]  loratadine (CLARITIN) 10 MG tablet Take 1 tablet (10 mg total) by mouth daily. Patient not taking: Reported on 09/23/2016 09/02/16   Tamala Julian, [place], CNM  predniSONE (DELTASONE) 20 MG tablet Take 3 tablets (60 mg total) by mouth daily. 12/07/17   Virna Livengood, Barbette Hair, MD  predniSONE (STERAPRED UNI-PAK 21 TAB) 10 MG (21) TBPK tablet Take 1 tablet (10 mg total) by mouth daily. Take 6 tabs by mouth daily  for 2 days, then 5 tabs for 2 days, then 4 tabs for 2 days, then 3 tabs for 2 days, 2 tabs for 2 days, then 1 tab by mouth daily for 2 days 09/23/16   Rancour, Annie Main, MD  valACYclovir (VALTREX) 500 MG tablet Take 500 mg by mouth daily as needed (for cold sores).    [provider]    Family  History No family history on file.  Social History Social History   Tobacco Use  . Smoking status: Current Every Day Smoker    Packs/day: 1.00    Types: Cigarettes  . Smokeless tobacco: Never Used  Substance Use Topics  . Alcohol use: Yes    Alcohol/week: 1.2 oz    Types: 2 Glasses of wine per week  . Drug use: No     Allergies   Bee pollen; Betadine [povidone iodine]; and Erythromycin   Review of Systems Review of Systems  Constitutional: Negative for fever.  HENT:       Facial swelling  Respiratory: Positive for cough. Negative for shortness of breath.   Cardiovascular: Negative for chest pain.  Gastrointestinal: Negative for nausea and vomiting.  Skin: Positive for rash.  All other systems reviewed and are negative.    Physical Exam Updated Vital Signs BP 134/84   Pulse (!) 104   Temp 97.9 F (36.6 C) (Oral)   Resp 16   Ht 5\' 6"  (1.676 m)   Wt 67.1 kg (148 lb)   SpO2 99%   BMI 23.89 kg/m   Physical Exam  Constitutional: She is oriented to person, place, and time. She appears well-developed and well-nourished.  ABCs intact, swelling and erythema to the bilateral eyes and cheeks  HENT:  Head: Normocephalic and atraumatic.  Oropharynx moist and clear, no swelling noted  Eyes: Pupils are equal, round, and reactive to light.  Cardiovascular: Normal rate, regular rhythm and normal heart sounds.  No murmur heard. Pulmonary/Chest: Effort normal. No respiratory distress. She has no wheezes.  Abdominal: Soft. Bowel sounds are normal. There is no tenderness.  Neurological: She is alert and oriented to person, place, and time.  Skin: Skin is warm and dry. No rash noted. There is erythema.  Blanching erythema over the anterior chest and bilateral hands  Psychiatric: She has a normal mood and affect.  Nursing note and vitals reviewed.    ED Treatments / Results  Labs (all labs ordered are listed, but only abnormal results are displayed) Labs Reviewed - No  data to display  EKG  EKG Interpretation None       Radiology No results found.  Procedures Procedures (including critical care time)  Medications Ordered in ED Medications  predniSONE (DELTASONE) tablet 60 mg (60 mg Oral Given 12/07/17 0430)  diphenhydrAMINE (BENADRYL) capsule 50 mg (50 mg Oral Given 12/07/17 0430)  hydrOXYzine (ATARAX/VISTARIL) tablet 25 mg (25 mg Oral Given 12/07/17 0431)  famotidine (PEPCID) tablet 20 mg (20 mg Oral Given 12/07/17 0430)     Initial Impression /  Assessment and Plan / ED Course  I have reviewed the triage vital signs and the nursing notes.  Pertinent labs & imaging results that were available during my care of the patient were reviewed by me and considered in my medical decision making (see chart for details).     Patient presents with acute on chronic allergic reaction.  Nontoxic appearing.  No signs and symptoms of anaphylaxis.  Patient given Atarax, Benadryl,, prednisone, and Pepcid.  She was observed with some effervescence of her symptoms.  Will discharge home with a burst dose of steroids and EpiPen.  Patient needs referral to allergist.  She does have a primary physician.  I have encouraged her to seek out an allergist.  After history, exam, and medical workup I feel the patient has been appropriately medically screened and is safe for discharge home. Pertinent diagnoses were discussed with the patient. Patient was given return precautions.   Final Clinical Impressions(s) / ED Diagnoses   Final diagnoses:  Allergic reaction, initial encounter    ED Discharge Orders        Ordered    predniSONE (DELTASONE) 20 MG tablet  Daily     12/07/17 0600    EPINEPHrine 0.3 mg/0.3 mL IJ SOAJ injection   Once     12/07/17 0600       Shalandria Elsbernd, Barbette Hair, MD 12/07/17 [phone]

## 2017-12-07 NOTE — ED Triage Notes (Signed)
Pt reports facial swelling, puffy/watery eyes progressively worse over the last two weeks. Reports she's had similar allergic reactions since October of 2017. OTC allergy meds not effective. States she came to ed tonight because her voice feels different.

## 2017-12-07 NOTE — ED Notes (Signed)
Swelling and redness have decreased but still noticeably present. Denies pain or itching.

## 2017-12-09 ENCOUNTER — Other Ambulatory Visit: Payer: Self-pay

## 2017-12-09 ENCOUNTER — Ambulatory Visit (INDEPENDENT_AMBULATORY_CARE_PROVIDER_SITE_OTHER): Payer: Self-pay | Admitting: Physician Assistant

## 2017-12-09 ENCOUNTER — Encounter: Payer: Self-pay | Admitting: Physician Assistant

## 2017-12-09 VITALS — BP 158/90 | HR 107 | Temp 98.2°F | Resp 16 | Ht 66.0 in | Wt 148.0 lb

## 2017-12-09 DIAGNOSIS — T7840XA Allergy, unspecified, initial encounter: Secondary | ICD-10-CM

## 2017-12-09 DIAGNOSIS — R21 Rash and other nonspecific skin eruption: Secondary | ICD-10-CM

## 2017-12-09 MED ORDER — PREDNISONE 20 MG PO TABS: ORAL_TABLET | ORAL | 0 refills | Status: DC

## 2017-12-09 NOTE — Progress Notes (Signed)
PRIMARY CARE AT [place], [place] [address] [phone]  Date:  12/09/2017   Name:  Shari Stewart   DOB:  [birth year]   MRN:  [id number]  PCP:  Default, Provider, MD    History of Present Illness:  Shari Stewart is a 42 y.o. female patient who presents to PCP with  Chief Complaint  Patient presents with  . Allergic Reaction    possible allergic reaction to her wedding dress or mold from work/ x sat. rash on arms and face,eyes puffy  . Medication Management    adderall, pt switching providers     Her symptoms appears to worsen when she comes to work.  She has 3 days of worsening facial redness and swelling.  She was taking benadryl and pepicid at home with minimal relief.  She has never seen an allergist.   She has some sob, nausea vomiting at the times she was seen at the ED.   She reports that she is taking the prednisone  She is taking the pepcid, claritin, and the benadryl.   She has had rash since the construction of her job.  She has had daily swelling since the construction which was around 09/01/2017, 3 months.   Her eye puffiness would stop if she is not at work.   She has hoarseness. She cleaned this with vacuuming.  She did not use a lot of disinfectant aerosol cleansers.  This is at a [place] [employer]  She notes there has been a lot of reports of the mold in the place.   She has no allergist studies, but through exposure--contrast, bee pollen.   No new hygeine products.   The last time she colored hair--3 weeks ago. She had a new dog October--whipped greyhound..  There are no active problems to display for this patient.   Past Medical History:  Diagnosis Date  . Allergy   . Cancer ([place])   . Medical history non-contributory     Past Surgical History:  Procedure Laterality Date  . BARTHOLIN CYST MARSUPIALIZATION Right 06/01/2013   Procedure: BARTHOLIN CYST MARSUPIALIZATION;  Surgeon: Princess Bruins, MD;  Location: [facility];  Service:  Gynecology;  Laterality: Right;  . CERVICAL BIOPSY  W/ LOOP ELECTRODE EXCISION  1991  . CESAREAN SECTION      Social History   Tobacco Use  . Smoking status: Current Every Day Smoker    Packs/day: 1.00    Types: Cigarettes  . Smokeless tobacco: Never Used  Substance Use Topics  . Alcohol use: Yes    Alcohol/week: 1.2 oz    Types: 2 Glasses of wine per week  . Drug use: No    History reviewed. No pertinent family history.  Allergies  Allergen Reactions  . Bee Pollen Swelling  . Betadine [Povidone Iodine]     BLISTERS  . Erythromycin Hives and Nausea And Vomiting    Medication list has been reviewed and updated.  Current Outpatient Medications on File Prior to Visit  Medication Sig Dispense Refill  . amphetamine-dextroamphetamine (ADDERALL XR) 30 MG 24 hr capsule Take 30 mg by mouth daily.    Marland Kitchen amphetamine-dextroamphetamine (ADDERALL) 15 MG tablet Take 15 mg by mouth 2 (two) times daily.    . busPIRone (BUSPAR) 15 MG tablet Take 15 mg by mouth 2 (two) times daily.    . cetirizine (ZYRTEC) 10 MG tablet Take 10 mg by mouth daily.    . cloNIDine HCl (KAPVAY) 0.1 MG TB12 ER  tablet Take by mouth at bedtime.    . diphenhydrAMINE (BENADRYL) 25 mg capsule Take 25 mg by mouth every 6 (six) hours as needed.    . famotidine (PEPCID) 20 MG tablet TAKE 1 TABLET BY MOUTH TWO TIMES DAILY 30 tablet 0  . loratadine (CLARITIN) 10 MG tablet TAKE 1 TABLET BY MOUTH DAILY 20 tablet 0  . valACYclovir (VALTREX) 500 MG tablet Take 500 mg by mouth daily as needed (for cold sores).     No current facility-administered medications on file prior to visit.     ROS ROS otherwise unremarkable unless listed above.  Physical Examination: BP (!) 158/90   Pulse (!) 107   Temp 98.2 F (36.8 C) (Oral)   Resp 16   Ht 5\' 6"  (1.676 m)   Wt 148 lb (67.1 kg)   LMP 11/18/2017   SpO2 100%   BMI 23.89 kg/m  Ideal Body Weight: Weight in (lb) to have BMI = 25: 154.6  Physical Exam  Constitutional: She  is oriented to person, place, and time. She appears well-developed and well-nourished. No distress.  HENT:  Head: Normocephalic and atraumatic.  Right Ear: Tympanic membrane, external ear and ear canal normal.  Left Ear: Tympanic membrane, external ear and ear canal normal.  Nose: Mucosal edema and rhinorrhea present. Right sinus exhibits no maxillary sinus tenderness and no frontal sinus tenderness. Left sinus exhibits no maxillary sinus tenderness and no frontal sinus tenderness.  Mouth/Throat: No uvula swelling. No oropharyngeal exudate, posterior oropharyngeal edema or posterior oropharyngeal erythema.  Eyes: Conjunctivae and EOM are normal. Pupils are equal, round, and reactive to light.  Cardiovascular: Normal rate and regular rhythm. Exam reveals no gallop, no distant heart sounds and no friction rub.  No murmur heard. Pulmonary/Chest: Effort normal. No respiratory distress. She has no decreased breath sounds. She has no wheezes. She has no rhonchi.  Lymphadenopathy:       Head (right side): No submandibular, no tonsillar, no preauricular and no posterior auricular adenopathy present.       Head (left side): No submandibular, no tonsillar, no preauricular and no posterior auricular adenopathy present.  Neurological: She is alert and oriented to person, place, and time.  Skin: She is not diaphoretic.  Psychiatric: She has a normal mood and affect. Her behavior is normal.     Assessment and Plan: [name] is a 42 y.o. female who is here today for cc of  Chief Complaint  Patient presents with  . Allergic Reaction    possible allergic reaction to her wedding dress or mold from work/ x sat. rash on arms and face,eyes puffy  . Medication Management    adderall, pt switching providers  she would like allergy referral which is appropriate at this time.  Given prednisone taper. She will consult her pharmacy regarding her adderall as there are multiple pharmacotherapy with the most  recent within the last month.   Allergic reaction, initial encounter - Plan: Ambulatory referral to Allergy  Rash and nonspecific skin eruption - Plan: predniSONE (DELTASONE) 20 MG tablet  Ivar Drape, PA-C [facility] 2/17/20196:26 PM

## 2017-12-09 NOTE — Patient Instructions (Addendum)
Please use the nasal saline as needed.   Please hydrate well with 64 oz of water if not more.   I am referring you to an allergist.  It says your last refill was 11/11/2017 with a psychiatrist.  This was picked up at a [facility].  Please contact them if you have concerns.     IF you received an x-ray today, you will receive an invoice from [facility]. Please contact [facility] at [phone] with questions or concerns regarding your invoice.   IF you received labwork today, you will receive an invoice from [facility]. Please contact [facility] at [phone] with questions or concerns regarding your invoice.   Our billing staff will not be able to assist you with questions regarding bills from these companies.  You will be contacted with the lab results as soon as they are available. The fastest way to get your results is to activate your My Chart account. Instructions are located on the last page of this paperwork. If you have not heard from [place] regarding the results in 2 weeks, please contact this office.

## 2018-01-07 ENCOUNTER — Encounter: Payer: Self-pay | Admitting: Allergy and Immunology

## 2018-01-21 ENCOUNTER — Other Ambulatory Visit: Payer: Self-pay | Admitting: Physician Assistant

## 2018-01-21 DIAGNOSIS — R21 Rash and other nonspecific skin eruption: Secondary | ICD-10-CM

## 2018-01-22 NOTE — Telephone Encounter (Signed)
Prednisone 20 mg tab   & Prednisone 50 mg tab [employer], PA  LOV 12/09/17  [id number] - [place], [place] [address]

## 2018-02-12 ENCOUNTER — Encounter: Payer: Self-pay | Admitting: Physician Assistant

## 2018-03-07 ENCOUNTER — Encounter (HOSPITAL_COMMUNITY): Payer: Self-pay | Admitting: Emergency Medicine

## 2018-03-07 ENCOUNTER — Emergency Department (HOSPITAL_COMMUNITY)
Admission: EM | Admit: 2018-03-07 | Discharge: 2018-03-07 | Disposition: A | Discharge status: Home / Self Care | Payer: Medicaid Other | Attending: Emergency Medicine | Admitting: Emergency Medicine

## 2018-03-07 DIAGNOSIS — Z79899 Other long term (current) drug therapy: Secondary | ICD-10-CM | POA: Insufficient documentation

## 2018-03-07 DIAGNOSIS — F17210 Nicotine dependence, cigarettes, uncomplicated: Secondary | ICD-10-CM | POA: Insufficient documentation

## 2018-03-07 DIAGNOSIS — K047 Periapical abscess without sinus: Secondary | ICD-10-CM | POA: Diagnosis not present

## 2018-03-07 DIAGNOSIS — K0889 Other specified disorders of teeth and supporting structures: Secondary | ICD-10-CM | POA: Diagnosis present

## 2018-03-07 MED ORDER — MELOXICAM 15 MG PO TABS: 15.0000 mg | ORAL_TABLET | Freq: Every day | ORAL | 0 refills | Status: DC

## 2018-03-07 MED ORDER — AMOXICILLIN 500 MG PO CAPS: 500.0000 mg | ORAL_CAPSULE | Freq: Three times a day (TID) | ORAL | 0 refills | Status: DC

## 2018-03-07 NOTE — ED Provider Notes (Signed)
[facility] EMERGENCY DEPARTMENT Provider Note   CSN: [id number] Arrival date & time: 03/07/18  2105     History   Chief Complaint Chief Complaint  Patient presents with  . Dental Pain    HPI Shari Stewart is a 42 y.o. female presents emergency department chief complaint of dental pain and swelling.  Patient states that she had a filling fall out of her tooth on the right lower side.  She developed pain and swelling beginning last night.  Filling fell out 2 days ago.  She states that her pain was improved with ibuprofen and Tylenol.  She denies any difficulty swallowing, fevers.  She has a follow-up appoint with her dentist next week.  HPI  Past Medical History:  Diagnosis Date  . Allergy   . 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 Medical history non-contributory     There are no active problems to display for this patient.   Past Surgical History:  Procedure Laterality Date  . BARTHOLIN CYST MARSUPIALIZATION Right 06/01/2013   Procedure: BARTHOLIN CYST MARSUPIALIZATION;  Surgeon: Princess Bruins, MD;  Location: [facility];  Service: Gynecology;  Laterality: Right;  . CERVICAL BIOPSY  W/ LOOP ELECTRODE EXCISION  1991  . CESAREAN SECTION       OB History    Gravida  2   Para  1   Term  1   Preterm      AB      Living  1     SAB      TAB      Ectopic      Multiple      Live Births               Home Medications    Prior to Admission medications   Medication Sig Start Date End Date Taking? Authorizing Provider  amphetamine-dextroamphetamine (ADDERALL XR) 30 MG 24 hr capsule Take 30 mg by mouth daily.    [provider]  amphetamine-dextroamphetamine (ADDERALL) 15 MG tablet Take 15 mg by mouth 2 (two) times daily.    [provider]  busPIRone (BUSPAR) 15 MG tablet Take 15 mg by mouth 2 (two) times daily.    [provider]  cetirizine (ZYRTEC) 10 MG tablet Take 10 mg by mouth daily.    [provider]  cloNIDine HCl (KAPVAY) 0.1 MG TB12 ER tablet Take by mouth at bedtime.    [provider]  diphenhydrAMINE (BENADRYL) 25 mg capsule Take 25 mg by mouth every 6 (six) hours as needed.    [provider]  famotidine (PEPCID) 20 MG tablet TAKE 1 TABLET BY MOUTH TWO TIMES DAILY 12/09/17   Tamala Julian, [place], CNM  loratadine (CLARITIN) 10 MG tablet TAKE 1 TABLET BY MOUTH DAILY 12/09/17   Tamala Julian, [place], CNM  predniSONE (DELTASONE) 20 MG tablet Take 3 PO QAM x2days, 2 PO QAM x2days, 1 PO QAM x3days 12/09/17   English, Stephanie D, PA  valACYclovir (VALTREX) 500 MG tablet Take 500 mg by mouth daily as needed (for cold sores).    [provider]    Family History No family history on file.  Social History Social History   Tobacco Use  . Smoking status: Current Every Day Smoker    Packs/day: 1.00    Types: Cigarettes  . Smokeless tobacco: Never Used  Substance Use Topics  . Alcohol use: Yes    Alcohol/week: 1.2 oz    Types: 2 Glasses of wine per  week  . Drug use: No     Allergies   Bee pollen; Betadine [povidone iodine]; and Erythromycin   Review of Systems Review of Systems  Constitutional: Negative for chills and fever.  HENT: Positive for dental problem and facial swelling. Negative for trouble swallowing and voice change.      Physical Exam Updated Vital Signs BP (!) 160/112 (BP Location: Right Arm)   Pulse (!) 105   Temp 98.2 F (36.8 C) (Oral)   Resp 18   Ht 5\' 6"  (1.676 m)   Wt 65.8 kg (145 lb)   SpO2 100%   BMI 23.40 kg/m   Physical Exam  Constitutional: She is oriented to person, place, and time. She appears well-developed and well-nourished. No distress.  HENT:  Head: Normocephalic and atraumatic.  Mouth/Throat: Uvula is midline. No trismus in the jaw. Dental caries present.    Eyes: Conjunctivae are normal. No scleral icterus.  Neck: Normal range of motion.  Cardiovascular: Normal rate, regular rhythm and normal heart sounds. Exam  reveals no gallop and no friction rub.  No murmur heard. Pulmonary/Chest: Effort normal and breath sounds normal. No respiratory distress.  Abdominal: Soft. Bowel sounds are normal. She exhibits no distension and no mass. There is no tenderness. There is no guarding.  Neurological: She is alert and oriented to person, place, and time.  Skin: Skin is warm and dry. She is not diaphoretic.  Psychiatric: Her behavior is normal.  Nursing note and vitals reviewed.    ED Treatments / Results  Labs (all labs ordered are listed, but only abnormal results are displayed) Labs Reviewed - No data to display  EKG None  Radiology No results found.  Procedures Procedures (including critical care time)  Medications Ordered in ED Medications - No data to display   Initial Impression / Assessment and Plan / ED Course  I have reviewed the triage vital signs and the nursing notes.  Pertinent labs & imaging results that were available during my care of the patient were reviewed by me and considered in my medical decision making (see chart for details).     Patient with toothache.  No gross abscess.  Exam unconcerning for Ludwig's angina or spread of infection.  Will treat with penicillin and pain medicine.  Urged patient to follow-up with dentist.     Final Clinical Impressions(s) / ED Diagnoses   Final diagnoses:  Dental abscess    ED Discharge Orders    None       Margarita Mail, PA-C 03/07/18 2210    Virgel Manifold, MD 03/11/18 1043

## 2018-03-07 NOTE — ED Notes (Signed)
One touch pt, see provider's assessment 

## 2018-03-07 NOTE — Discharge Instructions (Signed)
You have been seen by your caregiver because of dental pain.  °SEEK MEDICAL ATTENTION IF: °The exam and treatment you received today has been provided on an emergency basis only. This is not a substitute for complete medical or dental care. If your problem worsens or new symptoms (problems) appear, and you are unable to arrange prompt follow-up care with your dentist, call or return to this location. °CALL YOUR DENTIST OR RETURN IMMEDIATELY IF you develop a fever, rash, difficulty breathing or swallowing, neck or facial swelling, or other potentially serious concerns. ° ° °[employer]  °[employer]  °[facility]  °[address]  °[place], [place] [address]  °Phone [phone]  °[facility] [employer] [facility] in [place], [place], exemplifies the Dental School?s vision to improve the health and quality of life of all [place] by creating leaders with a passion to care for the underserved and by leading the nation in community-based, service learning oral health education. °We are committed to offering comprehensive general dental services for adults, children and special needs patients in a safe, caring and professional setting. ° °Appointments: Our clinic is open Monday through Friday 8:00 a.m. until 5:00 p.m. The amount of time scheduled for an appointment depends on the patient?s specific needs. We ask that you keep your appointed time for care or provide 24-hour notice of all appointment changes. Parents or legal guardians must accompany minor children. ° °Payment for Services: Medicaid and other insurance plans are welcome. Payment for services is due when services are rendered and may be made by cash or credit card. If you have dental insurance, we will assist you with your claim submission.  ° °Emergencies: Emergency services will be provided Monday through Friday on a  walk-in basis. Please arrive early for emergency services. After hours emergency services will be provided for patients of record as required. ° °Services:  °Comprehensive General Dentistry  °Children?s Dentistry  °Oral Surgery - Extractions  °Root Canals  °Sealants and Tooth Colored Fillings  °Crowns and Bridges  °Dentures and Partial Dentures  °Implant Services  °Periodontal Services and Cleanings  °Cosmetic Tooth Whitening  °Digital Radiography  °3-D/Cone Beam Imaging ° ° °

## 2018-03-07 NOTE — ED Triage Notes (Signed)
Pt reports R lower dental pain X2 days, states one of her fillings popped off, not able to see her dentist until next week. Pt states she has been drinking today to deal with the pain.

## 2018-05-05 DEATH — deceased

## 2019-03-06 DEATH — deceased

## 2019-09-29 ENCOUNTER — Other Ambulatory Visit: Payer: Self-pay

## 2019-09-29 DIAGNOSIS — Z20822 Contact with and (suspected) exposure to covid-19: Secondary | ICD-10-CM

## 2019-10-01 LAB — NOVEL CORONAVIRUS, NAA: SARS-CoV-2, NAA: NOT DETECTED

## 2020-02-05 ENCOUNTER — Ambulatory Visit: Payer: Medicaid Other | Attending: Internal Medicine

## 2020-02-05 DIAGNOSIS — Z23 Encounter for immunization: Secondary | ICD-10-CM

## 2020-02-05 NOTE — Progress Notes (Signed)
   Covid-19 Vaccination Clinic  Name:  Shari Stewart    MRN: [id number] DOB: 05-Nov-1976  02/05/2020  Shari Stewart was observed post Covid-19 immunization for 15 minutes without incident. She was provided with Vaccine Information Sheet and instruction to access the V-Safe system.   Shari Stewart was instructed to call 911 with any severe reactions post vaccine: Marland Kitchen Difficulty breathing  . Swelling of face and throat  . A fast heartbeat  . A bad rash all over body  . Dizziness and weakness   Immunizations Administered    Name Date Dose VIS Date Route   Pfizer COVID-19 Vaccine 02/05/2020  9:11 AM 0.3 mL 10/16/2019 Intramuscular   Manufacturer: [employer], [employer]   Lot: [id number]   [place]: [id number]

## 2020-02-29 ENCOUNTER — Ambulatory Visit: Payer: Medicaid Other | Attending: Internal Medicine

## 2021-05-23 ENCOUNTER — Encounter: Payer: Self-pay | Admitting: Neurology

## 2021-06-21 NOTE — Progress Notes (Deleted)
NEUROLOGY CONSULTATION NOTE  Shari Stewart MRN: [id number] DOB: 31-Jul-1976  Referring provider: Simona Huh, NP Primary care provider: Simona Huh, NP  Reason for consult:  headaches  Assessment/Plan:   ***   Subjective:  Shari Stewart is a 38 hear old female with ADHD and anxiety who presents for headaches.  History supplemented by referring provider's note.  Onset:  *** Location:  bilateral frontal and temporal region, top of head Quality:  throbbing, sharp, ache Intensity:  ***.   Aura:  *** Prodrome:  *** Associated symptoms:  nausea, vomiting, photophobia, phonophobia.   Duration:  *** Frequency:  *** Frequency of abortive medication: *** Triggers:  *** Relieving factors:  *** Activity:  ***  Current NSAIDS/analgesics:  *** Current triptans:  sumatriptan '100mg'$  Current ergotamine:  none Current anti-emetic:  Zofran '4mg'$  Current muscle relaxants:  *** Current Antihypertensive medications:  *** Current Antidepressant medications:  *** Current Anticonvulsant medications:  *** Current anti-CGRP:  *** Current Vitamins/Herbal/Supplements:  ferrous sulfate, D Current Antihistamines/Decongestants:  Claritin, Zyrtec, Xyzal Other therapy:  *** Hormone/birth control:  *** Other medications:  Adderall XR, alprazolam, BuSpar  Past NSAIDS/analgesics:  *** Past abortive triptans:  *** Past abortive ergotamine:  none Past muscle relaxants:  none Past anti-emetic:  *** Past antihypertensive medications:  clonidine Past antidepressant medications:  *** Past anticonvulsant medications:  *** Past anti-CGRP:  none Past vitamins/Herbal/Supplements:  none Past antihistamines/decongestants:  Benadryl Other past therapies:  ***  Caffeine:  *** Alcohol:  *** Smoker:  *** Diet:  *** Exercise:  *** Depression:  ***; Anxiety:  *** Other pain:  *** Sleep hygiene:  *** Family history of headache:  ***     PAST MEDICAL HISTORY: Past Medical History:  Diagnosis  Date   Allergy    Cancer ([place]) 1993   Cervical   Medical history non-contributory     PAST SURGICAL HISTORY: Past Surgical History:  Procedure Laterality Date   BARTHOLIN CYST MARSUPIALIZATION Right 06/01/2013   Procedure: BARTHOLIN CYST MARSUPIALIZATION;  Surgeon: Princess Bruins, MD;  Location: [facility];  Service: Gynecology;  Laterality: Right;   CERVICAL BIOPSY  W/ LOOP ELECTRODE EXCISION  1991   CESAREAN SECTION      MEDICATIONS: Current Outpatient Medications on File Prior to Visit  Medication Sig Dispense Refill   amoxicillin (AMOXIL) 500 MG capsule Take 1 capsule (500 mg total) by mouth 3 (three) times daily. 21 capsule 0   amphetamine-dextroamphetamine (ADDERALL XR) 30 MG 24 hr capsule Take 30 mg by mouth daily.     amphetamine-dextroamphetamine (ADDERALL) 15 MG tablet Take 15 mg by mouth 2 (two) times daily.     busPIRone (BUSPAR) 15 MG tablet Take 15 mg by mouth 2 (two) times daily.     cetirizine (ZYRTEC) 10 MG tablet Take 10 mg by mouth daily.     cloNIDine HCl (KAPVAY) 0.1 MG TB12 ER tablet Take by mouth at bedtime.     diphenhydrAMINE (BENADRYL) 25 mg capsule Take 25 mg by mouth every 6 (six) hours as needed.     famotidine (PEPCID) 20 MG tablet TAKE 1 TABLET BY MOUTH TWO TIMES DAILY 30 tablet 0   loratadine (CLARITIN) 10 MG tablet TAKE 1 TABLET BY MOUTH DAILY 20 tablet 0   meloxicam (MOBIC) 15 MG tablet Take 1 tablet (15 mg total) by mouth daily. Take 1 daily with food. 10 tablet 0   predniSONE (DELTASONE) 20 MG tablet Take 3 PO QAM x2days, 2 PO QAM x2days, 1 PO QAM x3days 13 tablet  0   valACYclovir (VALTREX) 500 MG tablet Take 500 mg by mouth daily as needed (for cold sores).     No current facility-administered medications on file prior to visit.    ALLERGIES: Allergies  Allergen Reactions   Bee Pollen Swelling   Betadine [Povidone Iodine]     BLISTERS   Erythromycin Hives and Nausea And Vomiting    FAMILY HISTORY: No family history on file.  Objective:   *** General: No acute distress.  Patient appears well-groomed.   Head:  Normocephalic/atraumatic Eyes:  fundi examined but not visualized Neck: supple, no paraspinal tenderness, full range of motion Back: No paraspinal tenderness Heart: regular rate and rhythm Lungs: Clear to auscultation bilaterally. Vascular: No carotid bruits. Neurological Exam: Mental status: alert and oriented to person, place, and time, recent and remote memory intact, fund of knowledge intact, attention and concentration intact, speech fluent and not dysarthric, language intact. Cranial nerves: CN I: not tested CN II: pupils equal, round and reactive to light, visual fields intact CN III, IV, VI:  full range of motion, no nystagmus, no ptosis CN V: facial sensation intact. CN VII: upper and lower face symmetric CN VIII: hearing intact CN IX, X: gag intact, uvula midline CN XI: sternocleidomastoid and trapezius muscles intact CN XII: tongue midline Bulk & Tone: normal, no fasciculations. Motor:  muscle strength 5/5 throughout Sensation:  Pinprick, temperature and vibratory sensation intact. Deep Tendon Reflexes:  2+ throughout,  toes downgoing.   Finger to nose testing:  Without dysmetria.   Heel to shin:  Without dysmetria.   Gait:  Normal station and stride.  Romberg negative.    Thank you for allowing me to take part in the care of this patient.  Metta Clines, DO  CC: Simona Huh, NP

## 2021-06-22 ENCOUNTER — Ambulatory Visit: Payer: Medicaid Other | Admitting: Neurology

## 2021-08-10 ENCOUNTER — Encounter (INDEPENDENT_AMBULATORY_CARE_PROVIDER_SITE_OTHER): Payer: Self-pay

## 2021-08-10 ENCOUNTER — Other Ambulatory Visit: Payer: Self-pay

## 2021-08-10 ENCOUNTER — Encounter (HOSPITAL_COMMUNITY): Payer: Self-pay | Admitting: Emergency Medicine

## 2021-08-10 ENCOUNTER — Telehealth: Payer: Medicaid Other | Admitting: Physician Assistant

## 2021-08-10 ENCOUNTER — Encounter (HOSPITAL_BASED_OUTPATIENT_CLINIC_OR_DEPARTMENT_OTHER): Payer: Self-pay

## 2021-08-10 ENCOUNTER — Emergency Department (HOSPITAL_BASED_OUTPATIENT_CLINIC_OR_DEPARTMENT_OTHER)
Admission: EM | Admit: 2021-08-10 | Discharge: 2021-08-11 | Disposition: A | Discharge status: Home / Self Care | Payer: Medicaid Other | Attending: Emergency Medicine | Admitting: Emergency Medicine

## 2021-08-10 ENCOUNTER — Ambulatory Visit (HOSPITAL_COMMUNITY)
Admission: EM | Admit: 2021-08-10 | Discharge: 2021-08-10 | Disposition: A | Discharge status: Home / Self Care | Payer: Medicaid Other

## 2021-08-10 DIAGNOSIS — T7840XA Allergy, unspecified, initial encounter: Secondary | ICD-10-CM

## 2021-08-10 DIAGNOSIS — Z85828 Personal history of other malignant neoplasm of skin: Secondary | ICD-10-CM | POA: Diagnosis not present

## 2021-08-10 DIAGNOSIS — R220 Localized swelling, mass and lump, head: Secondary | ICD-10-CM | POA: Insufficient documentation

## 2021-08-10 DIAGNOSIS — F17210 Nicotine dependence, cigarettes, uncomplicated: Secondary | ICD-10-CM | POA: Diagnosis not present

## 2021-08-10 DIAGNOSIS — R000 Tachycardia, unspecified: Secondary | ICD-10-CM | POA: Insufficient documentation

## 2021-08-10 DIAGNOSIS — T7840XD Allergy, unspecified, subsequent encounter: Secondary | ICD-10-CM | POA: Insufficient documentation

## 2021-08-10 LAB — CBC WITH DIFFERENTIAL/PLATELET
Abs Immature Granulocytes: 0.18 10*3/uL — ABNORMAL HIGH (ref 0.00–0.07)
Basophils Absolute: 0 10*3/uL (ref 0.0–0.1)
Basophils Relative: 0 %
Eosinophils Absolute: 0.1 10*3/uL (ref 0.0–0.5)
Eosinophils Relative: 0 %
HCT: 40.9 % (ref 36.0–46.0)
Hemoglobin: 13.6 g/dL (ref 12.0–15.0)
Immature Granulocytes: 1 %
Lymphocytes Relative: 32 %
Lymphs Abs: 4.9 10*3/uL — ABNORMAL HIGH (ref 0.7–4.0)
MCH: 29.9 pg (ref 26.0–34.0)
MCHC: 33.3 g/dL (ref 30.0–36.0)
MCV: 89.9 fL (ref 80.0–100.0)
Monocytes Absolute: 1.1 10*3/uL — ABNORMAL HIGH (ref 0.1–1.0)
Monocytes Relative: 7 %
Neutro Abs: 9.1 10*3/uL — ABNORMAL HIGH (ref 1.7–7.7)
Neutrophils Relative %: 60 %
Platelets: 260 10*3/uL (ref 150–400)
RBC: 4.55 MIL/uL (ref 3.87–5.11)
RDW: 13.4 % (ref 11.5–15.5)
WBC: 15.4 10*3/uL — ABNORMAL HIGH (ref 4.0–10.5)
nRBC: 0 % (ref 0.0–0.2)

## 2021-08-10 MED ORDER — SODIUM CHLORIDE 0.9 % IV BOLUS
1000.0000 mL | Freq: Once | INTRAVENOUS | Status: AC
  Administered 2021-08-10: 1000 mL via INTRAVENOUS

## 2021-08-10 NOTE — Progress Notes (Signed)
Based on what you shared with me, I feel your condition warrants further evaluation and I recommend that you be seen in a face to face visit.   This is absolutely above the scope of what we can evaluate and manage via e-visit or virtual urgent care visit. You need to be see in person ASAP at local Urgent Care or if still having shortness of breath, etc, nearest ER. Please do not delay care.  NOTE: There will be NO CHARGE for this eVisit   If you are having a true medical emergency please call 911.      For an urgent face to face visit, Shari Stewart has six urgent care centers for your convenience:     [facility] [place] [facility] [place] Get Driving Directions [phone] [place], [place] [address]    [facility] [place] [facility]) Get Driving Directions [phone] [place], [place] [address]  [facility] [place] ([place]) Get Driving Directions [phone] [address] [place],  [place]  [address]  [facility] Get Driving Directions [phone] [place], [place], [place] [address]   [facility] Get Driving Directions  [phone] [address], [place] [address]   [facility] [place] Get Driving Directions [phone] [address], [place],  [address]  Your MyChart E-visit questionnaire answers were reviewed by a board certified advanced clinical practitioner to complete your personal care plan based on your specific symptoms.  Thank you for using e-Visits.

## 2021-08-10 NOTE — ED Provider Notes (Signed)
[facility]    CSN: [id number] Arrival date & time: 08/10/21  1416      History   Chief Complaint Chief Complaint  Patient presents with   Oral Swelling   Facial Swelling    HPI Shari Stewart is a 45 y.o. female.   Patient here today for evaluation of continued allergic reaction symptoms. 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 She has had significant reactions to other substances in the past but states her most recent issues started after she was fitted for dentures and then started wearing new dentures. She reports now even opening her luggage where dentures were stored on her way back from [place] has triggered reactions. She reports facial swelling. She is taking several antihistamines including benadryl, cetirizine, levo-cetirizine, hydroxyzine, and is currently on a steroid taper she has been on since 07/25/21. She is not sure what she needs to do to help with allergy symptoms-- states that she called her allergist who recommended ED with any shortness of breath. She was not able to get in to see her PCP. She reports that she has also used SIX epipens in the last 2 weeks and that is all that seems to be effective. She is not currently having any shortness of breath or difficulty.   The history is provided by the patient.   Past Medical History:  Diagnosis Date   Allergy    Cancer [facility] 1993   Cervical   Medical history non-contributory     There are no problems to display for this patient.   Past Surgical History:  Procedure Laterality Date   BARTHOLIN CYST MARSUPIALIZATION Right 06/01/2013   Procedure: BARTHOLIN CYST MARSUPIALIZATION;  Surgeon: Princess Bruins, MD;  Location: [facility];  Service: Gynecology;  Laterality: Right;   CERVICAL BIOPSY  W/ LOOP ELECTRODE EXCISION  1991   CESAREAN SECTION      OB History     Gravida  2   Para  1   Term  1   Preterm      AB      Living  1      SAB       IAB      Ectopic      Multiple      Live Births               Home Medications    Prior to Admission medications   Medication Sig Start Date End Date Taking? Authorizing Provider  amoxicillin (AMOXIL) 500 MG capsule Take 1 capsule (500 mg total) by mouth 3 (three) times daily. 03/07/18   Harris, Vernie Shanks, PA-C  amphetamine-dextroamphetamine (ADDERALL XR) 30 MG 24 hr capsule Take 30 mg by mouth daily.    [provider]  amphetamine-dextroamphetamine (ADDERALL) 15 MG tablet Take 15 mg by mouth 2 (two) times daily.    [provider]  busPIRone (BUSPAR) 15 MG tablet Take 15 mg by mouth 2 (two) times daily.    [provider]  cetirizine (ZYRTEC) 10 MG tablet Take 10 mg by mouth daily.    [provider]  cloNIDine HCl (KAPVAY) 0.1 MG TB12 ER tablet Take by mouth at bedtime.    [provider]  diphenhydrAMINE (BENADRYL) 25 mg capsule Take 25 mg by mouth every 6 (six) hours as needed.    [provider]  famotidine (PEPCID) 20 MG tablet TAKE 1 TABLET BY MOUTH TWO TIMES DAILY 12/09/17  Tamala Julian, [place], CNM  loratadine (CLARITIN) 10 MG tablet TAKE 1 TABLET BY MOUTH DAILY 12/09/17   Tamala Julian, [place], CNM  meloxicam (MOBIC) 15 MG tablet Take 1 tablet (15 mg total) by mouth daily. Take 1 daily with food. 03/07/18   Margarita Mail, PA-C  predniSONE (DELTASONE) 20 MG tablet Take 3 PO QAM x2days, 2 PO QAM x2days, 1 PO QAM x3days 12/09/17   English, Stephanie D, PA  valACYclovir (VALTREX) 500 MG tablet Take 500 mg by mouth daily as needed (for cold sores).    [provider]    Family History History reviewed. No pertinent family history.  Social History Social History   Tobacco Use   Smoking status: Every Day    Packs/day: 1.00    Types: Cigarettes   Smokeless tobacco: Never  Substance Use Topics   Alcohol use: Yes    Alcohol/week: 2.0 standard drinks    Types: 2 Glasses of wine per week   Drug use: No     Allergies   Bee  pollen, Betadine [povidone iodine], and Erythromycin   Review of Systems Review of Systems  Constitutional:  Negative for chills and fever.  HENT:  Positive for facial swelling. Negative for congestion, mouth sores and trouble swallowing.   Eyes:  Negative for discharge and redness.  Respiratory:  Negative for shortness of breath.     Physical Exam Triage Vital Signs ED Triage Vitals [08/10/21 1450]  Enc Vitals Group     BP (!) 173/117     Pulse Rate (!) 121     Resp 18     Temp 98.1 F (36.7 C)     Temp src      SpO2 100 %     Weight      Height      Head Circumference      Peak Flow      Pain Score      Pain Loc      Pain Edu?      Excl. in [place]?    No data found.  Updated Vital Signs BP (!) 173/117   Pulse (!) 121   Temp 98.1 F (36.7 C)   Resp 18   SpO2 100%   Physical Exam Vitals and nursing note reviewed.  Constitutional:      General: She is not in acute distress.    Appearance: Normal appearance. She is not ill-appearing.  HENT:     Head: Normocephalic and atraumatic.     Comments: No facial swelling appreciated.     Mouth/Throat:     Mouth: Mucous membranes are moist.     Pharynx: Oropharynx is clear. No oropharyngeal exudate or posterior oropharyngeal erythema.  Eyes:     Conjunctiva/sclera: Conjunctivae normal.  Cardiovascular:     Rate and Rhythm: Normal rate.  Pulmonary:     Effort: Pulmonary effort is normal.  Skin:    Comments: Skin to chest, upper arms ,face erythematous  Neurological:     Mental Status: She is alert.  Psychiatric:        Mood and Affect: Mood normal.        Behavior: Behavior normal.     UC Treatments / Results  Labs (all labs ordered are listed, but only abnormal results are displayed) Labs Reviewed - No data to display  EKG   Radiology No results found.  Procedures Procedures (including critical care time)  Medications Ordered in UC Medications - No data to display  Initial Impression / Assessment  and Plan / UC Course  I have reviewed the triage vital signs and the nursing notes.  Pertinent labs & imaging results that were available during my care of the patient were reviewed by me and considered in my medical decision making (see chart for details).   Called to discuss patient with allergist Dr. [facility]-- recommended further evaluation in the ED and likely inpatient given use of EPI, etc. Advised patient of same. She plans to go to [facility] ED for further evaluation and treatment/ admission.  Final Clinical Impressions(s) / UC Diagnoses   Final diagnoses:  Allergic reaction, initial encounter   Discharge Instructions   None    ED Prescriptions   None    PDMP not reviewed this encounter.   Francene Finders, PA-C 08/10/21 1723

## 2021-08-10 NOTE — ED Notes (Signed)
Patient is being discharged from the Urgent Care and sent to the Emergency Department via POV . Per Ewell Poe PA , patient is in need of higher level of care due to facial/ oral swelling . Patient is aware and verbalizes understanding of plan of care.  Vitals:   08/10/21 1450  BP: (!) 173/117  Pulse: (!) 121  Resp: 18  Temp: 98.1 F (36.7 C)  SpO2: 100%

## 2021-08-10 NOTE — ED Triage Notes (Signed)
Pt reports facial  swelling that has  been  going on since she had dental implant last month. Pt states has had episodes of allergic reactions and she is  following a allergist / immunologist.  Pt was  in [place] and was told to go ED. Pt was in [facility] ED and decided to come over here due long wait.   No signs of respiratory distress at this time.

## 2021-08-10 NOTE — ED Triage Notes (Addendum)
Pt is present today with facial and oral swelling. Pt states that she recently had dental surgery in [place] and thinks she may have had a reaction to the material used in the temporary dentures.

## 2021-08-10 NOTE — ED Provider Notes (Addendum)
[name] Provider Note  CSN: [id number] Arrival date & time: 08/10/21 2101  Chief Complaint(s) Facial Swelling  HPI Shari Stewart is a 45 y.o. female who presents to the emergency department for concern for allergic reaction.  Patient reports that she recently had dentures done in [place] in September and had an allergic reaction to the dentures.  She was treated there on September 20.  Symptoms improved and she returned on the 22nd.  Symptoms recurred after she was exposed to the dentures again.  Patient saw an allergist and is currently on multiple allergy medicines as well as Pepcid and a steroid taper.  Patient was also placed on levofloxacin for possible infection.  She reports that she has been having intermittent bouts of facial swelling, gingival swelling, and difficulty swallowing after taking her medication.  Today's episode was more severe which prompted her to seek care at urgent care who spoke to her allergist and referred her to the emergency department for evaluation and possible admission.  Since then her symptoms have improved.  Patient does report feeling very anxious due to the situation and recurrent symptoms.  Denies any shortness of breath, current oral swelling or difficulty swallowing.  HPI  Past Medical History Past Medical History:  Diagnosis Date   Allergy    Cancer ([place]) 1993   Cervical   Medical history non-contributory    There are no problems to display for this patient.  Home Medication(s) Prior to Admission medications   Medication Sig Start Date End Date Taking? Authorizing Provider  amoxicillin (AMOXIL) 500 MG capsule Take 1 capsule (500 mg total) by mouth 3 (three) times daily. 03/07/18   Harris, Vernie Shanks, PA-C  amphetamine-dextroamphetamine (ADDERALL XR) 30 MG 24 hr capsule Take 30 mg by mouth daily.    [provider]  amphetamine-dextroamphetamine (ADDERALL) 15 MG tablet Take 15 mg by mouth 2 (two)  times daily.    [provider]  busPIRone (BUSPAR) 15 MG tablet Take 15 mg by mouth 2 (two) times daily.    [provider]  cetirizine (ZYRTEC) 10 MG tablet Take 10 mg by mouth daily.    [provider]  cloNIDine HCl (KAPVAY) 0.1 MG TB12 ER tablet Take by mouth at bedtime.    [provider]  diphenhydrAMINE (BENADRYL) 25 mg capsule Take 25 mg by mouth every 6 (six) hours as needed.    [provider]  famotidine (PEPCID) 20 MG tablet TAKE 1 TABLET BY MOUTH TWO TIMES DAILY 12/09/17   Tamala Julian, [place], CNM  loratadine (CLARITIN) 10 MG tablet TAKE 1 TABLET BY MOUTH DAILY 12/09/17   Tamala Julian, [place], CNM  meloxicam (MOBIC) 15 MG tablet Take 1 tablet (15 mg total) by mouth daily. Take 1 daily with food. 03/07/18   Margarita Mail, PA-C  predniSONE (DELTASONE) 20 MG tablet Take 3 PO QAM x2days, 2 PO QAM x2days, 1 PO QAM x3days 12/09/17   English, Stephanie D, PA  valACYclovir (VALTREX) 500 MG tablet Take 500 mg by mouth daily as needed (for cold sores).    [provider]  Past Surgical History Past Surgical History:  Procedure Laterality Date   BARTHOLIN CYST MARSUPIALIZATION Right 06/01/2013   Procedure: BARTHOLIN CYST MARSUPIALIZATION;  Surgeon: Princess Bruins, MD;  Location: [facility];  Service: Gynecology;  Laterality: Right;   CERVICAL BIOPSY  W/ LOOP ELECTRODE EXCISION  1991   CESAREAN SECTION     Family History No family history on file.  Social History Social History   Tobacco Use   Smoking status: Every Day    Packs/day: 1.00    Types: Cigarettes   Smokeless tobacco: Never  Substance Use Topics   Alcohol use: Yes    Alcohol/week: 2.0 standard drinks    Types: 2 Glasses of wine per week   Drug use: No   Allergies Bee pollen, Betadine [povidone iodine], and Erythromycin  Review of Systems Review of  Systems All other systems are reviewed and are negative for acute change except as noted in the HPI  Physical Exam Vital Signs  I have reviewed the triage vital signs BP 120/79 (BP Location: Right Arm)   Pulse 90   Temp 98.3 F (36.8 C)   Resp 15   Ht 5\' 6"  (1.676 m)   Wt 68.9 kg   SpO2 100%   BMI 24.52 kg/m   Physical Exam Vitals reviewed.  Constitutional:      General: She is not in acute distress.    Appearance: She is well-developed. She is not diaphoretic.  HENT:     Head: Normocephalic and atraumatic.     Comments: Edentulous. No oral swelling    Nose: Nose normal.     Mouth/Throat:     Lips: No lesions.     Mouth: No angioedema.     Dentition: No gingival swelling, dental abscesses or gum lesions.     Palate: No lesions.     Pharynx: No pharyngeal swelling, posterior oropharyngeal erythema or uvula swelling.     Tonsils: No tonsillar exudate.  Eyes:     General: No scleral icterus.       Right eye: No discharge.        Left eye: No discharge.     Conjunctiva/sclera: Conjunctivae normal.     Pupils: Pupils are equal, round, and reactive to light.  Cardiovascular:     Rate and Rhythm: Normal rate and regular rhythm.     Heart sounds: No murmur heard.   No friction rub. No gallop.  Pulmonary:     Effort: Pulmonary effort is normal. No respiratory distress.     Breath sounds: Normal breath sounds. No stridor. No rales.  Abdominal:     General: There is no distension.     Palpations: Abdomen is soft.     Tenderness: There is no abdominal tenderness.  Musculoskeletal:        General: No tenderness.     Cervical back: Normal range of motion and neck supple.  Skin:    General: Skin is warm and dry.     Findings: No erythema or rash.  Neurological:     Mental Status: She is alert and oriented to person, place, and time.    ED Results and Treatments Labs (all labs ordered are listed, but only abnormal results are displayed) Labs Reviewed  CBC WITH  DIFFERENTIAL/PLATELET - Abnormal; Notable for the following components:      Result Value   WBC 15.4 (*)    Neutro Abs 9.1 (*)    Lymphs Abs 4.9 (*)    Monocytes Absolute 1.1 (*)  Abs Immature Granulocytes 0.18 (*)    All other components within normal limits  COMPREHENSIVE METABOLIC PANEL - Abnormal; Notable for the following components:   BUN 24 (*)    Total Protein 6.2 (*)    ALT 68 (*)    All other components within normal limits                                                                                                                         EKG  EKG Interpretation  Date/Time: 08/10/2021  23:27:16   Ventricular Rate:   101 PR Interval:   109 QRS Duration:  71 QT Interval: 343   QTC Calculation: 445   R Axis:   70  Text Interpretation:  Sinus tachycardia       Radiology No results found.  Pertinent labs & imaging results that were available during my care of the patient were reviewed by me and considered in my medical decision making (see MDM for details).  Medications Ordered in ED Medications  sodium chloride 0.9 % bolus 1,000 mL (0 mLs Intravenous Stopped 08/11/21 0041)                                                                                                                                     Procedures Procedures  (including critical care time)  Medical Decision Making / ED Course I have reviewed the nursing notes for this encounter and the patient's prior records (if available in EHR or on provided paperwork).  JAELLA WEINERT was evaluated in Emergency Department on 08/11/2021 for the symptoms described in the history of present illness. She was evaluated in the context of the global COVID-19 pandemic, which necessitated consideration that the patient might be at risk for infection with the SARS-CoV-2 virus that causes COVID-19. Institutional protocols and algorithms that pertain to the evaluation of patients at risk for COVID-19 are in a state of  rapid change based on information released by regulatory bodies including the CDC and federal and state organizations. These policies and algorithms were followed during the patient's care in the ED.     45 y.o. female here with several weeks of recurring allergic reaction symptoms.  Currently no respiratory, GI, or neurologic symptoms to suggest anaphylaxis.  On exam, there is no evidence of oral swelling or airway compromise.   Labs notable for leukocytosis likely from steroid use. HR improved  with rest and IVF.  Monitored for 5 hrs. Symptoms without recurrence.  Safe for discharge with strict return precautions.   Pertinent labs & imaging results that were available during my care of the patient were reviewed by me and considered in my medical decision making:  Recommended discussion with allergist regarding DC of Levaquin as well as recent test results.  Final Clinical Impression(s) / ED Diagnoses Final diagnoses:  Allergic reaction, subsequent encounter   The patient appears reasonably screened and/or stabilized for discharge and I doubt any other medical condition or other [facility] requiring further screening, evaluation, or treatment in the ED at this time prior to discharge. Safe for discharge with strict return precautions.  Disposition: Discharge  Condition: Good  I have discussed the results, Dx and Tx plan with the patient/family who expressed understanding and agree(s) with the plan. Discharge instructions discussed at length. The patient/family was given strict return precautions who verbalized understanding of the instructions. No further questions at time of discharge.    ED Discharge Orders     None       Follow Up: Dr. Nathanial Rancher  Call  and discuss stopping Levofloxacin and results of your recent allergy testing.     This chart was dictated using voice recognition software.  Despite best efforts to proofread,  errors can occur which can change the  documentation meaning.      Fatima Blank, MD 08/11/21 [phone]

## 2021-08-11 LAB — COMPREHENSIVE METABOLIC PANEL
ALT: 68 U/L — ABNORMAL HIGH (ref 0–44)
AST: 18 U/L (ref 15–41)
Albumin: 3.9 g/dL (ref 3.5–5.0)
Alkaline Phosphatase: 78 U/L (ref 38–126)
Anion gap: 9 (ref 5–15)
BUN: 24 mg/dL — ABNORMAL HIGH (ref 6–20)
CO2: 26 mmol/L (ref 22–32)
Calcium: 9 mg/dL (ref 8.9–10.3)
Chloride: 103 mmol/L (ref 98–111)
Creatinine, Ser: 0.94 mg/dL (ref 0.44–1.00)
GFR, Estimated: >60 mL/min (ref >60)
Glucose, Bld: 79 mg/dL (ref 70–99)
Potassium: 3.6 mmol/L (ref 3.5–5.1)
Sodium: 138 mmol/L (ref 135–145)
Total Bilirubin: 0.4 mg/dL (ref 0.3–1.2)
Total Protein: 6.2 g/dL — ABNORMAL LOW (ref 6.5–8.1)

## 2021-08-11 NOTE — ED Notes (Signed)
This RN presented the AVS utilizing Teachback Method. Patient verbalizes understanding of Discharge Instructions. Opportunity for Questioning and Answers were provided. Patient Discharged from ED ambulatory to Home.   

## 2021-09-06 ENCOUNTER — Ambulatory Visit: Payer: Medicaid Other | Admitting: Allergy & Immunology

## 2021-09-11 NOTE — Progress Notes (Deleted)
NEUROLOGY CONSULTATION NOTE  Shari Stewart MRN: [id number] DOB: Jun 15, 1976  Referring provider: Simona Huh, NP Primary care provider: Simona Huh, NP  Reason for consult:  headaches  Assessment/Plan:   ***   Subjective:  Shari Stewart is a 45 year old female with ADHD, anxiety and history of cervical cancer who presents for headaches.  History supplemented by referring provider's note.  Onset:  *** Location:  bilateral fronto-temporal regions, top of head, periorbital Quality:  throbbing Intensity:  ***.  She denies new headache, thunderclap headache or severe headache that wakes her from sleep. Aura:  scotoma Prodrome:  absent Associated symptoms:  Nausea, vomiting, photophobia, phonophobia.  She denies associated unilateral numbness or weakness. Duration:  1 to 2 days Frequency:  *** Frequency of abortive medication: *** Triggers:  stress, change in weather Relieving factors:  *** Activity:  ***  Current NSAIDS/analgesics:  *** Current triptans:  sumatriptan 100mg  Current ergotamine:  *** Current anti-emetic:  Zofran 4mg  Current muscle relaxants:  *** Current Antihypertensive medications:  metoprolol succinate ER 25mg   Current Antidepressant medications:  *** Current Anticonvulsant medications:  *** Current anti-CGRP:  *** Current Vitamins/Herbal/Supplements:  D, ferrous sulfate, B12 Current Antihistamines/Decongestants:  Claritin, Flonase, Zyrtec Other therapy:  *** Hormone/birth control:  Nexplanon Other medications:  Adderall, alprazolam, buspirone  Past NSAIDS/analgesics:  *** Past abortive triptans:  *** Past abortive ergotamine:  *** Past muscle relaxants:  *** Past anti-emetic:  *** Past antihypertensive medications:  *** Past antidepressant medications:  *** Past anticonvulsant medications:  *** Past anti-CGRP:  *** Past vitamins/Herbal/Supplements:  *** Past antihistamines/decongestants:  *** Other past therapies:   prednisone  Caffeine:  *** Alcohol:  *** Smoker:  current smoker - 1.5 packs cigarettes daily Diet:  *** Exercise:  *** Depression:  ***; Anxiety:  *** Other pain:  *** Sleep hygiene:  *** Family history of headache:  ***   CBC and CMP from 08/10/2021 reviewed.   PAST MEDICAL HISTORY: Past Medical History:  Diagnosis Date   Allergy    Cancer ([place]) 1993   Cervical   Medical history non-contributory     PAST SURGICAL HISTORY: Past Surgical History:  Procedure Laterality Date   BARTHOLIN CYST MARSUPIALIZATION Right 06/01/2013   Procedure: BARTHOLIN CYST MARSUPIALIZATION;  Surgeon: Princess Bruins, MD;  Location: [facility];  Service: Gynecology;  Laterality: Right;   CERVICAL BIOPSY  W/ LOOP ELECTRODE EXCISION  1991   CESAREAN SECTION      MEDICATIONS: Current Outpatient Medications on File Prior to Visit  Medication Sig Dispense Refill   amoxicillin (AMOXIL) 500 MG capsule Take 1 capsule (500 mg total) by mouth 3 (three) times daily. 21 capsule 0   amphetamine-dextroamphetamine (ADDERALL XR) 30 MG 24 hr capsule Take 30 mg by mouth daily.     amphetamine-dextroamphetamine (ADDERALL) 15 MG tablet Take 15 mg by mouth 2 (two) times daily.     busPIRone (BUSPAR) 15 MG tablet Take 15 mg by mouth 2 (two) times daily.     cetirizine (ZYRTEC) 10 MG tablet Take 10 mg by mouth daily.     cloNIDine HCl (KAPVAY) 0.1 MG TB12 ER tablet Take by mouth at bedtime.     diphenhydrAMINE (BENADRYL) 25 mg capsule Take 25 mg by mouth every 6 (six) hours as needed.     famotidine (PEPCID) 20 MG tablet TAKE 1 TABLET BY MOUTH TWO TIMES DAILY 30 tablet 0   loratadine (CLARITIN) 10 MG tablet TAKE 1 TABLET BY MOUTH DAILY 20 tablet 0   meloxicam (MOBIC)  15 MG tablet Take 1 tablet (15 mg total) by mouth daily. Take 1 daily with food. 10 tablet 0   predniSONE (DELTASONE) 20 MG tablet Take 3 PO QAM x2days, 2 PO QAM x2days, 1 PO QAM x3days 13 tablet 0   valACYclovir (VALTREX) 500 MG tablet Take 500 mg by mouth  daily as needed (for cold sores).     No current facility-administered medications on file prior to visit.    ALLERGIES: Allergies  Allergen Reactions   Bee Pollen Swelling   Betadine [Povidone Iodine]     BLISTERS   Erythromycin Hives and Nausea And Vomiting    FAMILY HISTORY: No family history on file.  Objective:  *** General: No acute distress.  Patient appears well-groomed.   Head:  Normocephalic/atraumatic Eyes:  fundi examined but not visualized Neck: supple, no paraspinal tenderness, full range of motion Back: No paraspinal tenderness Heart: regular rate and rhythm Lungs: Clear to auscultation bilaterally. Vascular: No carotid bruits. Neurological Exam: Mental status: alert and oriented to person, place, and time, recent and remote memory intact, fund of knowledge intact, attention and concentration intact, speech fluent and not dysarthric, language intact. Cranial nerves: CN I: not tested CN II: pupils equal, round and reactive to light, visual fields intact CN III, IV, VI:  full range of motion, no nystagmus, no ptosis CN V: facial sensation intact. CN VII: upper and lower face symmetric CN VIII: hearing intact CN IX, X: gag intact, uvula midline CN XI: sternocleidomastoid and trapezius muscles intact CN XII: tongue midline Bulk & Tone: normal, no fasciculations. Motor:  muscle strength 5/5 throughout Sensation:  Pinprick, temperature and vibratory sensation intact. Deep Tendon Reflexes:  2+ throughout,  toes downgoing.   Finger to nose testing:  Without dysmetria.   Heel to shin:  Without dysmetria.   Gait:  Normal station and stride.  Romberg negative.    Thank you for allowing me to take part in the care of this patient.  Metta Clines, DO  CC: ***

## 2021-09-12 ENCOUNTER — Ambulatory Visit: Payer: Medicaid Other | Admitting: Neurology

## 2021-09-13 ENCOUNTER — Ambulatory Visit: Payer: Medicaid Other | Admitting: Allergy & Immunology

## 2021-09-18 ENCOUNTER — Other Ambulatory Visit: Payer: Self-pay

## 2021-09-18 ENCOUNTER — Ambulatory Visit (INDEPENDENT_AMBULATORY_CARE_PROVIDER_SITE_OTHER): Payer: Medicaid Other | Admitting: Allergy

## 2021-09-18 VITALS — BP 150/100 | Temp 97.7°F | Resp 18 | Ht 66.0 in | Wt 155.6 lb

## 2021-09-18 DIAGNOSIS — T7840XD Allergy, unspecified, subsequent encounter: Secondary | ICD-10-CM

## 2021-09-18 NOTE — Progress Notes (Signed)
New Patient Note  RE: Shari Stewart MRN: [id number] DOB: 07-26-1976 Date of Office Visit: 09/18/2021  Consult requested by: Shari Huh, NP Primary care provider: Simona Huh, NP  Chief Complaint: Allergic Reaction (Patient in today due to allergic reaction she is having since 07/25/21 that started with her dentures. She is now reacting to everything randomly. )  History of Present Illness: I had the pleasure of seeing Shari Stewart for initial evaluation at the [facility] on 09/20/2021. She is a 45 y.o. female, who is referred here by Shari Huh, NP for the evaluation of allergic reactions.  Patient got her teeth removed and implants placed in [place] in September 2022 with no issues. She had temporary dentures placed and while they were placing the reagents on her dentures, she noticed some facial/lip swelling, flushing of her chest within a few minutes.  The reagent was removed and she took zyrtec, benadryl, famotidine, prednisone which helped. The swelling improved by the following day.  They tried to put the reagents back the following day and she had similar facial swelling episode so she did not have the temporary dentures placed. She continued to take her zyrtec, benadryl, famotidine and prednisone.  She came back home to the [place] and when she opened her suitcase that contained the dentures in a container, she noted that her face was starting to swell again. Denies any associated rash.   She placed the dentures outdoors. The swelling resolved after a few days.  Then on her way to the dentist she started to swell again - apparently the dentures were in a tin and she was not touching them.  Patient has an allergist that she follows at [facility] center for an allergic reaction she had a few years ago after wearing a wedding dress. She thinks it was the formaldehyde that caused the reaction.  She had some bloodwork done recently which was  negative to environmental allergies - reviewed this on her phone.  The tryptase and latex bloodwork was pending. She is not sure if they were abnormal or not.   Denies any fevers, chills, changes in medications, foods, personal care products or recent infections.  Previous history of swelling: to the wedding dress. She did break out after hives/rash after having an epidural in the past as well. She said she broke out the plastic drapes? Patient is up to date with the following cancer screening tests: mammogram, physical exam. No personal history of cancer.  No family history of angioedema/urticaria.   Reviewed images on the phone. It was difficult to notice any significant swelling - there was a subtle periorbital swelling on the left side noted. In some images there was a more prominent lower lip.   Patient states that she used her Epipen multiple times during these swelling episodes and that her insurance won't refill the Epipen anymore.   08/10/2021 UC visit: "Patient here today for evaluation of continued allergic reaction symptoms. 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 She has had significant reactions to other substances in the past but states her most recent issues started after she was fitted for dentures and then started wearing new dentures. She reports now even opening her luggage where dentures were stored on her way back from [place] has triggered reactions. She reports facial swelling. She is taking several antihistamines including benadryl, cetirizine, levo-cetirizine, hydroxyzine, and is currently on a steroid taper she has  been on since 07/25/21. She is not sure what she needs to do to help with allergy symptoms-- states that she called her allergist who recommended ED with any shortness of breath. She was not able to get in to see her PCP. She reports that she has also used SIX epipens in the last 2 weeks and that is all that seems to be  effective. She is not currently having any shortness of breath or difficulty. "  08/10/2021 ER visit: "Shari Stewart is a 45 y.o. female who presents to the emergency department for concern for allergic reaction.  Patient reports that she recently had dentures done in [place] in September and had an allergic reaction to the dentures.  She was treated there on September 20.  Symptoms improved and she returned on the 22nd.  Symptoms recurred after she was exposed to the dentures again.  Patient saw an allergist and is currently on multiple allergy medicines as well as Pepcid and a steroid taper.  Patient was also placed on levofloxacin for possible infection.  She reports that she has been having intermittent bouts of facial swelling, gingival swelling, and difficulty swallowing after taking her medication.  Today's episode was more severe which prompted her to seek care at urgent care who spoke to her allergist and referred her to the emergency department for evaluation and possible admission.   Since then her symptoms have improved.   Patient does report feeling very anxious due to the situation and recurrent symptoms.   Denies any shortness of breath, current oral swelling or difficulty swallowing."  Assessment and Plan: Shari Stewart is a 45 y.o. female with: Allergic reaction Patient has history of allergic reactions and not sure if the trigger was ever identified as she follows with an allergist at [facility] and notes are not available for review during OV today. More recently she had facial/lip swelling and chest flushing after getting her dentures put in [place]. Apparently she required epinephrine, zyrtec, benadryl, famotidine and prednisone. After that event, she kept having recurrence of the swelling and went to the ER and urgent care for this. She had bloodwork drawn which was negative to environmental allergies. Latex and tryptase level were still pending. Patient wants to  know if we can test for the chemicals that were used in the dentures as currently she is edentulous making it difficult for her to eat. Plan is to go back to [place] to get her permanent teeth placed. Discussed with patient that we don't have these chemicals available for skin testing and patch testing. I don't think patient needs patch testing for this as her reactions are not contact dermatitis related.  Asked patient to sign release of information so we can get records and lab results from her allergist at [facility] to see if I need to order any additional labs. Also requested that patient find out what exactly will be used to place her permanent dentures and what was used in the past. Patient states she will get in contact with the dentist and will let us know. We also discussed that she may need to go to an academic center for further evaluation depending on the information she provides [place]. Patient left before the visit was completed and she did not sign the release of information sheet.  Called the patient the following day regarding this but phone call went to voicemail and left a brief voicemail to call the office back - no return call  yet as of 09/20/2021.  Return if symptoms worsen or fail to improve.  No orders of the defined types were placed in this encounter.  Lab Orders  No laboratory test(s) ordered today    Other allergy screening: Asthma: no Rhino conjunctivitis:  Mild rhinitis symptoms. Food allergy: no Medication allergy: yes Hymenoptera allergy:  localized reaction History of recurrent infections suggestive of immunodeficency: no  Diagnostics: None.   Past Medical History: Patient Active Problem List   Diagnosis Date Noted   Allergic reaction 09/20/2021    Past Medical History:  Diagnosis Date   Allergy    Cancer [facility]) 1993   Cervical   Medical history non-contributory    Past Surgical History: Past Surgical History:  Procedure Laterality  Date   BARTHOLIN CYST MARSUPIALIZATION Right 06/01/2013   Procedure: BARTHOLIN CYST MARSUPIALIZATION;  Surgeon: Princess Bruins, MD;  Location: [place] [facility];  Service: Gynecology;  Laterality: Right;   CERVICAL BIOPSY  W/ LOOP ELECTRODE EXCISION  1991   CESAREAN SECTION     Medication List:  Current Outpatient Medications  Medication Sig Dispense Refill   amphetamine-dextroamphetamine (ADDERALL XR) 30 MG 24 hr capsule Take 30 mg by mouth daily.     amphetamine-dextroamphetamine (ADDERALL) 15 MG tablet Take 15 mg by mouth 2 (two) times daily.     busPIRone (BUSPAR) 15 MG tablet Take 15 mg by mouth 2 (two) times daily.     cetirizine (ZYRTEC) 10 MG tablet Take 10 mg by mouth daily.     cloNIDine HCl (KAPVAY) 0.1 MG TB12 ER tablet Take by mouth at bedtime.     diphenhydrAMINE (BENADRYL) 25 mg capsule Take 25 mg by mouth every 6 (six) hours as needed.     famotidine (PEPCID) 20 MG tablet TAKE 1 TABLET BY MOUTH TWO TIMES DAILY 30 tablet 0   loratadine (CLARITIN) 10 MG tablet TAKE 1 TABLET BY MOUTH DAILY 20 tablet 0   meloxicam (MOBIC) 15 MG tablet Take 1 tablet (15 mg total) by mouth daily. Take 1 daily with food. 10 tablet 0   predniSONE (DELTASONE) 20 MG tablet Take 3 PO QAM x2days, 2 PO QAM x2days, 1 PO QAM x3days 13 tablet 0   valACYclovir (VALTREX) 500 MG tablet Take 500 mg by mouth daily as needed (for cold sores).     No current facility-administered medications for this visit.   Allergies: Allergies  Allergen Reactions   Bee Pollen Swelling   Betadine [Povidone Iodine]     BLISTERS   Erythromycin Hives and Nausea And Vomiting   Social History: Social History   Socioeconomic History   Marital status: Single    Spouse name: Not on file   Number of children: Not on file   Years of education: Not on file   Highest education level: Not on file  Occupational History   Not on file  Tobacco Use   Smoking status: Every Day    Packs/day: 1.00    Types: Cigarettes   Smokeless  tobacco: Never  Substance and Sexual Activity   Alcohol use: Yes    Alcohol/week: 2.0 standard drinks    Types: 2 Glasses of wine per week   Drug use: No   Sexual activity: Yes    Birth control/protection: None  Other Topics Concern   Not on file  Social History Narrative   Not on file   Social Determinants of Health   Financial Resource Strain: Not on file  Food Insecurity: Not on file  Transportation Needs: Not on file  Physical Activity: Not on file  Stress: Not on file  Social Connections: Not on file   Lives in a house which is 47 years old. Smoking: 1 ppd x 32 yrs Occupation: self-employed, Printmaker History: Water Damage/mildew in the house: yes Carpet in the family room: no Carpet in the bedroom: no Heating: gas Cooling: central Pet: yes 3 dogs  Family History: History reviewed. No pertinent family history. Problem                               Relation Asthma                                   no Eczema                                no Food allergy                          no Allergic rhino conjunctivitis     no  Review of Systems  Constitutional:  Negative for appetite change, chills, fever and unexpected weight change.  HENT:  Negative for congestion and rhinorrhea.        Facial swelling  Eyes:  Negative for itching.  Respiratory:  Negative for cough, chest tightness, shortness of breath and wheezing.   Cardiovascular:  Negative for chest pain.  Gastrointestinal:  Negative for abdominal pain.  Genitourinary:  Negative for difficulty urinating.  Skin:  Negative for rash.  Neurological:  Negative for headaches.   Objective: BP (!) 150/100   Temp 97.7 F (36.5 C) (Temporal)   Resp 18   Ht 5\' 6"  (1.676 m)   Wt 155 lb 9.6 oz (70.6 kg)   BMI 25.11 kg/m  Body mass index is 25.11 kg/m. Physical Exam Vitals and nursing note reviewed.  Constitutional:      Appearance: Normal appearance. She is well-developed.  HENT:     Head:  Normocephalic and atraumatic.     Right Ear: Tympanic membrane and external ear normal.     Left Ear: Tympanic membrane and external ear normal.     Nose: Nose normal.     Mouth/Throat:     Mouth: Mucous membranes are moist.     Pharynx: Oropharynx is clear.     Comments: Edentulous.  Eyes:     Conjunctiva/sclera: Conjunctivae normal.  Cardiovascular:     Rate and Rhythm: Normal rate and regular rhythm.     Heart sounds: Normal heart sounds. No murmur heard.   No friction rub. No gallop.  Pulmonary:     Effort: Pulmonary effort is normal.     Breath sounds: Normal breath sounds. No wheezing, rhonchi or rales.  Musculoskeletal:     Cervical back: Neck supple.  Skin:    General: Skin is warm.     Findings: No rash.  Neurological:     Mental Status: She is alert and oriented to person, place, and time.  Psychiatric:        Behavior: Behavior normal.   The plan was reviewed with the patient/family, and all questions/concerned were addressed.  It was my pleasure to see Alexarae today and participate in her care. Please feel free to contact me with any questions or concerns.  Sincerely,  Rexene Alberts,  DO Allergy & Immunology  [facility] office: [place] office: [phone]

## 2021-09-18 NOTE — Patient Instructions (Addendum)
Requesting records from previous allergist.  Find out what are the ingredients used for the dentures and products.  For mild symptoms you can take over the counter antihistamines such as Benadryl and monitor symptoms closely. If symptoms worsen or if you have severe symptoms including breathing issues, throat closure, significant swelling, whole body hives, severe diarrhea and vomiting, lightheadedness then inject epinephrine and seek immediate medical care afterwards.

## 2021-09-19 ENCOUNTER — Telehealth: Payer: Self-pay | Admitting: Allergy

## 2021-09-19 NOTE — Telephone Encounter (Signed)
Called and left a voicemail to call us back.  Patient left yesterday without signing release forms and finishing up the visit.

## 2021-09-20 ENCOUNTER — Encounter: Payer: Self-pay | Admitting: Allergy

## 2021-09-20 DIAGNOSIS — T7840XA Allergy, unspecified, initial encounter: Secondary | ICD-10-CM | POA: Insufficient documentation

## 2021-09-20 NOTE — Assessment & Plan Note (Addendum)
Patient has history of allergic reactions and not sure if the trigger was ever identified as she follows with an allergist at [facility] and notes are not available for review during OV today. More recently she had facial/lip swelling and chest flushing after getting her dentures put in [place]. Apparently she required epinephrine, zyrtec, benadryl, famotidine and prednisone. After that event, she kept having recurrence of the swelling and went to the ER and urgent care for this. She had bloodwork drawn which was negative to environmental allergies. Latex and tryptase level were still pending. Patient wants to know if we can test for the chemicals that were used in the dentures as currently she is edentulous making it difficult for her to eat. Plan is to go back to [place] to get her permanent teeth placed.  Discussed with patient that we don't have these chemicals available for skin testing and patch testing. I don't think patient needs patch testing for this as her reactions are not contact dermatitis related.   Asked patient to sign release of information so we can get records and lab results from her allergist at [facility] to see if I need to order any additional labs. Also requested that patient find out what exactly will be used to place her permanent dentures and what was used in the past. Patient states she will get in contact with the dentist and will let us know. We also discussed that she may need to go to an academic center for further evaluation depending on the information she provides [place].  Patient left before the visit was completed and she did not sign the release of information sheet.   Called the patient the following day regarding this but phone call went to voicemail and left a brief voicemail to call the office back - no return call yet as of 09/20/2021.

## 2021-10-17 ENCOUNTER — Telehealth: Payer: Self-pay

## 2021-10-17 NOTE — Telephone Encounter (Signed)
Noted  

## 2021-10-17 NOTE — Telephone Encounter (Signed)
Patient called stating she has a kit that she received from her dentist that requires blood work. She is wanting to get it done in our office, but I requested that she brings it to our office for Dr Maudie Mercury and Ernst Bowler to review before moving forward. Patient states it needs to be done by Thursday so it can be mailed back to the company.   Patient states she is going to come by our [place] office around 4:30 today. I did let her know Dr Maudie Mercury is not coming to [place] anymore this week and she requested if Dr Ernst Bowler could review this Kit?  I also placed a Release of Information form up front for the girls to get filled out per Dr Noel Journey last note she did need records from [place] to have the patient find out what exactly will be used to place her permanent dentures.

## 2021-10-17 NOTE — Telephone Encounter (Signed)
We reviewed the test and it is a IT sales professional. It seems to have the tubes and whatnot for collection and the ability to send it it back via UPS.   Discussed with Ernst Bowler and she said she could not send this test since there was no other in [place]. I wondered whether we could just charge her for a blood draw, but per Ernst Bowler that is against the policy.   Rachelle gave her numbers for other labs that might be able to draw the lab.   Of note, the test was ordered by her dentist. I am unsure why her dentist is not helping her with this endeavor.   Salvatore Marvel, MD [facility] [place]

## 2021-10-18 ENCOUNTER — Telehealth: Payer: Self-pay | Admitting: Allergy

## 2021-10-18 NOTE — Telephone Encounter (Signed)
Patient came in to the office on 12/13 and filled out a medical release form.  I faxed the form to [facility] on 12/14.

## 2021-10-28 ENCOUNTER — Emergency Department (HOSPITAL_COMMUNITY)
Admission: EM | Admit: 2021-10-28 | Discharge: 2021-10-28 | Disposition: A | Discharge status: Home / Self Care | Payer: Medicaid Other | Attending: Emergency Medicine | Admitting: Emergency Medicine

## 2021-10-28 ENCOUNTER — Encounter: Payer: Medicaid Other | Admitting: Nurse Practitioner

## 2021-10-28 ENCOUNTER — Other Ambulatory Visit: Payer: Self-pay

## 2021-10-28 DIAGNOSIS — S0591XA Unspecified injury of right eye and orbit, initial encounter: Secondary | ICD-10-CM | POA: Diagnosis present

## 2021-10-28 DIAGNOSIS — S0501XA Injury of conjunctiva and corneal abrasion without foreign body, right eye, initial encounter: Secondary | ICD-10-CM | POA: Diagnosis not present

## 2021-10-28 DIAGNOSIS — X58XXXA Exposure to other specified factors, initial encounter: Secondary | ICD-10-CM | POA: Diagnosis not present

## 2021-10-28 DIAGNOSIS — Z8541 Personal history of malignant neoplasm of cervix uteri: Secondary | ICD-10-CM | POA: Diagnosis not present

## 2021-10-28 DIAGNOSIS — H5710 Ocular pain, unspecified eye: Secondary | ICD-10-CM

## 2021-10-28 DIAGNOSIS — F17210 Nicotine dependence, cigarettes, uncomplicated: Secondary | ICD-10-CM | POA: Insufficient documentation

## 2021-10-28 MED ORDER — TETRACAINE HCL 0.5 % OP SOLN
2.0000 [drp] | Freq: Once | OPHTHALMIC | Status: AC
  Administered 2021-10-28: 22:00:00 2 [drp] via OPHTHALMIC
  Filled 2021-10-28 (×2): qty 4

## 2021-10-28 MED ORDER — OFLOXACIN 0.3 % OP SOLN
2.0000 [drp] | Freq: Four times a day (QID) | OPHTHALMIC | Status: DC
  Administered 2021-10-28: 23:00:00 2 [drp] via OPHTHALMIC
  Filled 2021-10-28: qty 5

## 2021-10-28 MED ORDER — FLUORESCEIN SODIUM 1 MG OP STRP
1.0000 | ORAL_STRIP | Freq: Once | OPHTHALMIC | Status: AC
  Administered 2021-10-28: 22:00:00 1 via OPHTHALMIC
  Filled 2021-10-28 (×2): qty 1

## 2021-10-28 NOTE — Progress Notes (Signed)
I would recommend any saline eye drops over the counter. Your insurance does not pay for saline eye drops however you can purchase a generic for about $5. If your eye pain continues you will need to see an actual eye doctor to ensure the cornea is still intact   NOTE: You will NOT be charged for this eVisit.   If you do not have a PCP, [facility] offers a free physician referral service available at [phone]. Our trained staff has the experience, knowledge and resources to put you in touch with a physician who is right for you.      If you are having a true medical emergency please call 911.     Your e-visit answers were reviewed by a board certified advanced clinical practitioner to complete your personal care plan.  Thank you for using e-Visits.

## 2021-10-28 NOTE — ED Triage Notes (Signed)
Pt reported to ED with c/o eye injury after accidentally jabbing paper bag into eye today. Pt noted to have eye covered with cloth for reported sensitivity to light. Pt's eye noted to be red, endorses some blurriness with vision d/t excessive tear production.

## 2021-10-28 NOTE — ED Notes (Signed)
Pt ready for discharge. Pharmacy notified to send eyedrops ASAP as we are waiting on them for discharge. Instructions reviewed with pt and pt notified of Poc

## 2021-10-28 NOTE — ED Provider Notes (Signed)
[facility] EMERGENCY DEPARTMENT Provider Note   CSN: [id number] Arrival date & time: 10/28/21  2006     History Chief Complaint  Patient presents with   Eye Problem    Shari Stewart is a 45 y.o. female who presents today for evaluation of right eye pain.  At about 6 PM today she was getting out of her car and accidentally poked herself with a brown paper bag in the right eye.  She has had blurred vision since.  She states that the pain has been worsening.  She denies any fevers or other injuries.  She reports her last tetanus shot was within the past 5 years.    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   Medical history non-contributory     Patient Active Problem List   Diagnosis Date Noted   Allergic reaction 09/20/2021    Past Surgical History:  Procedure Laterality Date   BARTHOLIN CYST MARSUPIALIZATION Right 06/01/2013   Procedure: BARTHOLIN CYST MARSUPIALIZATION;  Surgeon: Princess Bruins, MD;  Location: [facility];  Service: Gynecology;  Laterality: Right;   CERVICAL BIOPSY  W/ LOOP ELECTRODE EXCISION  1991   CESAREAN SECTION       OB History     Gravida  2   Para  1   Term  1   Preterm      AB      Living  1      SAB      IAB      Ectopic      Multiple      Live Births              No family history on file.  Social History   Tobacco Use   Smoking status: Every Day    Packs/day: 1.00    Types: Cigarettes   Smokeless tobacco: Never  Substance Use Topics   Alcohol use: Yes    Alcohol/week: 2.0 standard drinks    Types: 2 Glasses of wine per week   Drug use: No    Home Medications Prior to Admission medications   Medication Sig Start Date End Date Taking? Authorizing Provider  amphetamine-dextroamphetamine (ADDERALL XR) 30 MG 24 hr capsule Take 30 mg by mouth daily.    [provider]  amphetamine-dextroamphetamine (ADDERALL) 15 MG tablet Take  15 mg by mouth 2 (two) times daily.    [provider]  busPIRone (BUSPAR) 15 MG tablet Take 15 mg by mouth 2 (two) times daily.    [provider]  cetirizine (ZYRTEC) 10 MG tablet Take 10 mg by mouth daily.    [provider]  cloNIDine HCl (KAPVAY) 0.1 MG TB12 ER tablet Take by mouth at bedtime.    [provider]  diphenhydrAMINE (BENADRYL) 25 mg capsule Take 25 mg by mouth every 6 (six) hours as needed.    [provider]  famotidine (PEPCID) 20 MG tablet TAKE 1 TABLET BY MOUTH TWO TIMES DAILY 12/09/17   Tamala Julian, [place], CNM  loratadine (CLARITIN) 10 MG tablet TAKE 1 TABLET BY MOUTH DAILY 12/09/17   Tamala Julian, [place], CNM  meloxicam (MOBIC) 15 MG tablet Take 1 tablet (15 mg total) by mouth daily. Take 1 daily with food. 03/07/18   Margarita Mail, PA-C  predniSONE (DELTASONE) 20 MG tablet Take 3 PO QAM x2days, 2 PO QAM x2days, 1 PO QAM x3days 12/09/17  Joretta Bachelor, PA  valACYclovir (VALTREX) 500 MG tablet Take 500 mg by mouth daily as needed (for cold sores).    [provider]    Allergies    Bee pollen, Betadine [povidone iodine], Erythromycin, and Gris-peg [griseofulvin]  Review of Systems   Review of Systems  Eyes:  Positive for photophobia, pain, discharge, redness and visual disturbance.  All other systems reviewed and are negative.  Physical Exam Updated Vital Signs BP (!) 177/119 (BP Location: Right Arm)    Pulse 98    Temp 99 F (37.2 C) (Oral)    Resp (!) 21    Ht 5\' 6"  (1.676 m)    Wt 70.3 kg    SpO2 100%    BMI 25.02 kg/m   Physical Exam Vitals and nursing note reviewed.  Constitutional:      General: She is not in acute distress.    Appearance: She is not ill-appearing.  HENT:     Head: Normocephalic and atraumatic.  Eyes:     General: Lids are normal. Vision grossly intact. Gaze aligned appropriately.     Extraocular Movements: Extraocular movements intact.     Conjunctiva/sclera: Conjunctivae normal.      Comments: Patient's right eye is slightly injected.  Pupils bilaterally are equal, round, reactive to light.  Fluorescein stain performed of the right eye shows a corneal abrasion in the temporal lower quadrant of the eye.  Negative Seidel sign.   No APD.  No consensual photophobia.  For diplopia and eye pain both fully resolved after tetracaine  Cardiovascular:     Rate and Rhythm: Normal rate.  Pulmonary:     Effort: Pulmonary effort is normal. No respiratory distress.  Abdominal:     General: There is no distension.  Musculoskeletal:     Cervical back: Normal range of motion and neck supple.     Comments: No obvious acute injury  Skin:    General: Skin is warm.  Neurological:     Mental Status: She is alert.     Comments: Awake and alert, answers all questions appropriately.  Speech is not slurred.  Psychiatric:        Mood and Affect: Mood normal.        Behavior: Behavior normal.    ED Results / Procedures / Treatments   Labs (all labs ordered are listed, but only abnormal results are displayed) Labs Reviewed - No data to display  EKG None  Radiology No results found.  Procedures Procedures   Medications Ordered in ED Medications  fluorescein ophthalmic strip 1 strip (has no administration in time range)  tetracaine (PONTOCAINE) 0.5 % ophthalmic solution 2 drop (has no administration in time range)  ofloxacin (OCUFLOX) 0.3 % ophthalmic solution 2 drop (has no administration in time range)    ED Course  I have reviewed the triage vital signs and the nursing notes.  Pertinent labs & imaging results that were available during my care of the patient were reviewed by me and considered in my medical decision making (see chart for details).    MDM Rules/Calculators/A&P                          Patient is a 45 year old woman who presents today for evaluation of pain in her right eye after she accidentally scratched it on a brown paper bag this afternoon. She has  photophobia.  No consensual photophobia.  She states that her vision is slightly off  however she is still able to read without significant difficulty.  Her photophobia, eye pain fully resolved with topical anesthetic. She states her last tetanus shot was within the past 5 years. She has a corneal abrasion on the right eye in the lower temporal field.  She does not wear contacts. Will treat with ofloxacin drops.  Discussed options for pain control, she wishes to hold off on any medications at this time.  Negative Seidel sign, pupil is round, no evidence of globe rupture.  She is given ophthalmology follow-up.  Return precautions were discussed with patient who states their understanding.  At the time of discharge patient denied any unaddressed complaints or concerns.  Patient is agreeable for discharge home.  Note: Portions of this report may have been transcribed using voice recognition software. Every effort was made to ensure accuracy; however, inadvertent computerized transcription errors may be present   Final Clinical Impression(s) / ED Diagnoses Final diagnoses:  Abrasion of right cornea, initial encounter    Rx / DC Orders ED Discharge Orders     None        Ollen Gross 10/28/21 [place], Ankit, MD 10/29/21 [phone]

## 2021-10-28 NOTE — ED Notes (Signed)
10/25 via visual acuity w/ right eye that was hit with paper bag. Unable to read correct letters after 10/25

## 2021-10-28 NOTE — ED Provider Notes (Signed)
Emergency Medicine Provider Triage Evaluation Note  Shari Stewart , a 45 y.o. female  was evaluated in triage.  Pt complains of right eye pain.  Patient states that around 6 PM she was getting out of her car with a brown paper bag.  She states that the paper bag caught her in her right eye.  Since then she has had severe, sharp pain with foreign body sensation.  She is also having clear tearing.  Pain is worse when she looks down.  Review of Systems  Positive: See above Negative:   Physical Exam  BP (!) 177/119 (BP Location: Right Arm)    Pulse 98    Temp 99 F (37.2 C) (Oral)    Resp (!) 21    SpO2 100%  Gen:   Awake, no distress   Resp:  Normal effort  MSK:   Moves extremities without difficulty  Other:  Right eye injected with clear tearing.  No obvious global injury or obvious corneal laceration or abrasion.  Do not appreciate any foreign bodies over the cornea or under the upper or lower eyelid.  Medical Decision Making  Medically screening exam initiated at 8:47 PM.  Appropriate orders placed.  Shari Stewart was informed that the remainder of the evaluation will be completed by another provider, this initial triage assessment does not replace that evaluation, and the importance of remaining in the ED until their evaluation is complete.     Mickie Hillier, PA-C 10/28/21 2049    Wyvonnia Dusky, MD 10/29/21 [phone]

## 2021-10-28 NOTE — Progress Notes (Signed)
I have spent 5 minutes in review of e-visit questionnaire, review and updating patient chart, medical decision making and response to patient.  ° °Mairi Stagliano W Karstyn Birkey, NP ° °  °

## 2021-10-28 NOTE — Discharge Instructions (Addendum)
To use your eyedrops please place 2 drops 4 times a day.  Please do this for 5 days.  Please call the eye doctor when they open for an appointment.   If your vision changes, you develop any new or concerning symptoms please seek additional medical care and evaluation.  While in the ED your blood pressure was high.  Please follow up with your primary care doctor or the wellness clinic for repeat evaluation as you may need medication.  High blood pressure can cause long term, potentially serious, damage if left untreated.

## 2021-11-15 ENCOUNTER — Telehealth: Payer: Medicaid Other | Admitting: Physician Assistant

## 2021-11-15 DIAGNOSIS — F424 Excoriation (skin-picking) disorder: Secondary | ICD-10-CM

## 2021-11-15 DIAGNOSIS — L810 Postinflammatory hyperpigmentation: Secondary | ICD-10-CM

## 2021-11-15 NOTE — Patient Instructions (Signed)
°  Shari Stewart, thank you for joining [name], PA-C for today's virtual visit.  While this provider is not your primary care provider (PCP), if your PCP is located in our provider database this encounter information will be shared with them immediately following your visit.  Consent: (Patient) Shari Stewart provided verbal consent for this virtual visit at the beginning of the encounter.  Current Medications:  Current Outpatient Medications:    amphetamine-dextroamphetamine (ADDERALL XR) 30 MG 24 hr capsule, Take 30 mg by mouth daily., Disp: , Rfl:    amphetamine-dextroamphetamine (ADDERALL) 15 MG tablet, Take 15 mg by mouth 2 (two) times daily., Disp: , Rfl:    busPIRone (BUSPAR) 15 MG tablet, Take 15 mg by mouth 2 (two) times daily., Disp: , Rfl:    cetirizine (ZYRTEC) 10 MG tablet, Take 10 mg by mouth daily., Disp: , Rfl:    cloNIDine HCl (KAPVAY) 0.1 MG TB12 ER tablet, Take by mouth at bedtime., Disp: , Rfl:    diphenhydrAMINE (BENADRYL) 25 mg capsule, Take 25 mg by mouth every 6 (six) hours as needed., Disp: , Rfl:    famotidine (PEPCID) 20 MG tablet, TAKE 1 TABLET BY MOUTH TWO TIMES DAILY, Disp: 30 tablet, Rfl: 0   loratadine (CLARITIN) 10 MG tablet, TAKE 1 TABLET BY MOUTH DAILY, Disp: 20 tablet, Rfl: 0   meloxicam (MOBIC) 15 MG tablet, Take 1 tablet (15 mg total) by mouth daily. Take 1 daily with food., Disp: 10 tablet, Rfl: 0   predniSONE (DELTASONE) 20 MG tablet, Take 3 PO QAM x2days, 2 PO QAM x2days, 1 PO QAM x3days, Disp: 13 tablet, Rfl: 0   valACYclovir (VALTREX) 500 MG tablet, Take 500 mg by mouth daily as needed (for cold sores)., Disp: , Rfl:    Medications ordered in this encounter:  No orders of the defined types were placed in this encounter.    *If you need refills on other medications prior to your next appointment, please contact your pharmacy*  Follow-Up: Call back or seek an in-person evaluation if the symptoms worsen or if the condition fails to  improve as anticipated.  Other Instructions Please reach out to your primary care provider and/or allergist for further evaluation and management and potential referral to Dermatologist if things are not resolving, as you have multiple allergic reactions to typical medications we would use to calm this down.   If you have been instructed to have an in-person evaluation today at a local Urgent Care facility, please use the link below. It will take you to a list of all of our available [facility], including address, phone number and hours of operation. Please do not delay care.  [facility]  If you or a family member do not have a primary care provider, use the link below to schedule a visit and establish care. When you choose a [facility] primary care physician or advanced practice provider, you gain a long-term partner in health. Find a Primary Care Provider  Learn more about [facility] in-office and virtual care options: [facility]

## 2021-11-15 NOTE — Progress Notes (Signed)
Virtual Visit Consent   Shari Stewart, you are scheduled for a virtual visit with a [facility] provider today.     Just as with appointments in the office, your consent must be obtained to participate.  Your consent will be active for this visit and any virtual visit you may have with one of our providers in the next 365 days.     If you have a MyChart account, a copy of this consent can be sent to you electronically.  All virtual visits are billed to your insurance company just like a traditional visit in the office.    As this is a virtual visit, video technology does not allow for your provider to perform a traditional examination.  This may limit your provider's ability to fully assess your condition.  If your provider identifies any concerns that need to be evaluated in person or the need to arrange testing (such as labs, EKG, etc.), we will make arrangements to do so.     Although advances in technology are sophisticated, we cannot ensure that it will always work on either your end or our end.  If the connection with a video visit is poor, the visit may have to be switched to a telephone visit.  With either a video or telephone visit, we are not always able to ensure that we have a secure connection.     I need to obtain your verbal consent now.   Are you willing to proceed with your visit today?    JOYCLYN PLAZOLA has provided verbal consent on 11/15/2021 for a virtual visit (video or telephone).   Shari Stewart, [place]   Date: 11/15/2021 7:54 PM   Virtual Visit via Video Note   I, Shari Stewart, connected with  Shari Stewart  ([id number], Jan 28, 1976) on 11/15/21 at  7:45 PM EST by a video-enabled telemedicine application and verified that I am speaking with the correct person using two identifiers.  Location: Patient: Virtual Visit Location Patient: Home Provider: Virtual Visit Location Provider: Home Office   I discussed the limitations of evaluation and  management by telemedicine and the availability of in person appointments. The patient expressed understanding and agreed to proceed.    History of Present Illness: Shari Stewart is a 46 y.o. who identifies as a female who was assigned female at birth, and is being seen today for rash x several weeks following a bout of poison sumac. . Notes scatted almost ulcerated lesions of lower extremities and upper extremities, a few on her torso. Also notes areas of small red spots that look like "blood spots" to her. Also noting a scaly patch on the inside of thighs bilaterally where sumac was previously. Denies fever, chills. Unfortunately has been dealing with severe allergic reactions to multiple known and unknown sources. Is followed by an allergist and has been taken off of a lot of her previous anxiety medications. Cannot tolerate prednisone due to severe reported reaction.   HPI: HPI  Problems:  Patient Active Problem List   Diagnosis Date Noted   Allergic reaction 09/20/2021    Allergies:  Allergies  Allergen Reactions   Bee Pollen Swelling   Betadine [Povidone Iodine]     BLISTERS   Erythromycin Hives and Nausea And Vomiting   Gris-Peg [Griseofulvin] Swelling   Medications:  Current Outpatient Medications:    amphetamine-dextroamphetamine (ADDERALL XR) 30 MG 24 hr capsule, Take 30 mg by mouth daily., Disp: , Rfl:    amphetamine-dextroamphetamine (  ADDERALL) 15 MG tablet, Take 15 mg by mouth 2 (two) times daily., Disp: , Rfl:    busPIRone (BUSPAR) 15 MG tablet, Take 15 mg by mouth 2 (two) times daily., Disp: , Rfl:    cetirizine (ZYRTEC) 10 MG tablet, Take 10 mg by mouth daily., Disp: , Rfl:    cloNIDine HCl (KAPVAY) 0.1 MG TB12 ER tablet, Take by mouth at bedtime., Disp: , Rfl:    famotidine (PEPCID) 20 MG tablet, TAKE 1 TABLET BY MOUTH TWO TIMES DAILY, Disp: 30 tablet, Rfl: 0   loratadine (CLARITIN) 10 MG tablet, TAKE 1 TABLET BY MOUTH DAILY, Disp: 20 tablet, Rfl: 0   meloxicam  (MOBIC) 15 MG tablet, Take 1 tablet (15 mg total) by mouth daily. Take 1 daily with food., Disp: 10 tablet, Rfl: 0   predniSONE (DELTASONE) 20 MG tablet, Take 3 PO QAM x2days, 2 PO QAM x2days, 1 PO QAM x3days, Disp: 13 tablet, Rfl: 0   valACYclovir (VALTREX) 500 MG tablet, Take 500 mg by mouth daily as needed (for cold sores)., Disp: , Rfl:   Observations/Objective: Patient is well-developed, well-nourished in no acute distress.  Resting comfortably at home.  Head is normocephalic, atraumatic.  No labored breathing. Speech is clear and coherent with logical content.  Patient is alert and oriented at baseline.   Assessment and Plan: 1. Postinflammatory hyperpigmentation  2. Skin-picking disorder  After recent allergic dermatitis that has resolved but left with areas of postinflammatory hyperpigmentation and suspected skin picking. She acknowledges skin picking itself but does not think this is the cause. Has had to stop anxiety medications and has to avoid use of steroids. Notes she is taken off her antihistamines per allergist for further testing. Suspect if we could restart a strong antihistamine or anti-anxiety medication this would calm things down and reduce skin picking. Further evaluation with allergist would hopefully reveal safe for her to use steroid creams to calm down some of the postinflammatory hyperpigmentation and areas of skin thickening from scratching. She is to contact her allergist for further evaluation and management as this is above the scope of what can be safely managed via virtual health.   Follow Up Instructions: I discussed the assessment and treatment plan with the patient. The patient was provided an opportunity to ask questions and all were answered. The patient agreed with the plan and demonstrated an understanding of the instructions.  A copy of instructions were sent to the patient via MyChart unless otherwise noted below.   The patient was advised to call back  or seek an in-person evaluation if the symptoms worsen or if the condition fails to improve as anticipated.  Time:  I spent 15 minutes with the patient via telehealth technology discussing the above problems/concerns.    Shari Rio, PA-C

## 2021-11-22 ENCOUNTER — Encounter: Payer: Self-pay | Admitting: Allergy

## 2021-11-22 NOTE — Progress Notes (Signed)
Received a CD with a 500+ page pdf file of records from [facility]. Printed out a few encounters to scan relating to allergic reaction visits.  Hymenoptera panel, mold and environmental allergy panel were all negative. Tryptase, latex IgE, total IgE was ordered but no results in the file.

## 2021-11-27 ENCOUNTER — Encounter: Payer: Self-pay | Admitting: Allergy

## 2021-11-30 ENCOUNTER — Ambulatory Visit (INDEPENDENT_AMBULATORY_CARE_PROVIDER_SITE_OTHER): Payer: Medicaid Other | Admitting: Allergy

## 2021-11-30 ENCOUNTER — Encounter: Payer: Self-pay | Admitting: Allergy

## 2021-11-30 ENCOUNTER — Other Ambulatory Visit: Payer: Self-pay

## 2021-11-30 VITALS — BP 150/100 | HR 86 | Temp 98.2°F | Resp 16 | Ht 66.0 in | Wt 158.4 lb

## 2021-11-30 DIAGNOSIS — Z720 Tobacco use: Secondary | ICD-10-CM | POA: Diagnosis not present

## 2021-11-30 DIAGNOSIS — R030 Elevated blood-pressure reading, without diagnosis of hypertension: Secondary | ICD-10-CM | POA: Diagnosis not present

## 2021-11-30 DIAGNOSIS — T783XXD Angioneurotic edema, subsequent encounter: Secondary | ICD-10-CM

## 2021-11-30 DIAGNOSIS — R21 Rash and other nonspecific skin eruption: Secondary | ICD-10-CM | POA: Diagnosis not present

## 2021-11-30 DIAGNOSIS — T783XXA Angioneurotic edema, initial encounter: Secondary | ICD-10-CM | POA: Insufficient documentation

## 2021-11-30 DIAGNOSIS — T7840XD Allergy, unspecified, subsequent encounter: Secondary | ICD-10-CM

## 2021-11-30 NOTE — Assessment & Plan Note (Signed)
•   See below for proper skincare.   If no improvement, recommend dermatology evaluation next.

## 2021-11-30 NOTE — Assessment & Plan Note (Addendum)
Still having facial swelling at times with no triggers. No respiratory compromise.  Concerned about mold allergies as the home they are renting is old. Mold and environmental panel was negative as per the [facility] records.   Keep track of episodes and take pictures.   Discussed smoking cessation.  Regarding the mold - gave handout on environmental control measures.  Recommend moving out of the home or mold remediation.   From allergy perspective, I do not order mycotoxins IgG testing as that is controversial and utility is questionable. She is seeing an integrative medicine specialist for this.   Get bloodwork to rule out any other causes.

## 2021-11-30 NOTE — Progress Notes (Signed)
Follow Up Note  RE: [name] MRN: [id number] DOB: Oct 04, 1976 Date of Office Visit: 11/30/2021  Referring provider: Simona Huh, NP Primary care provider: Simona Huh, NP  Chief Complaint: Allergic Reaction  History of Present Illness: I had the pleasure of seeing Shari Stewart for a follow up visit at the [facility] on 11/30/2021. She is a 46 y.o. female, who is being followed for allergic reactions. Her previous allergy office visit was on 09/18/2021 with Dr. Maudie Mercury. Today is a new complaint visit of allergic reactions .  Allergic reaction Patient had bloodwork drawn for the [facility] materials reactivity and she had some reactivity to some items. Her dentist is aware of these results she is going to get her permanent teeth in [place] placed in March.  Still having issues with swelling of the face at times. She has some sores on her legs which are not healing as well either.   Still smokes and trying to quit. She has been vaping to try to quit tobacco.   Patient lives in an old house and concerned about mold allergies or mold exposure contributing to her symptoms. She tested her home for molds yesterday to see what type of mold is in her house. She has been living in this home October 2021. This is a rental home and planning on moving out.  She noticed increased periorbital swelling and itchy eyes since moved in there. Her son also has been more frequent illnesses.  Patient spoke to Dr. Adriana Simas who is an integrative medical physician who is recommending some mycotoxin testing which would be an out of pocket expense for her. She is not sure if she is going to get it drawn.   Patient also complaints of hair loss and rashes on her inner thigh. No longer taking daily antihistamines.   Assessment and Plan: Shari Stewart is a 46 y.o. female with: Allergic reaction Past history -  history of allergic reactions and not sure if the trigger was ever  identified as she follows with an allergist at [facility]. More recently she had facial/lip swelling and chest flushing after getting her dentures put in [place]. Apparently she required epinephrine, zyrtec, benadryl, famotidine and prednisone. After that event, she kept having recurrence of the swelling and went to the ER and urgent care for this. She had bloodwork drawn which was negative to environmental allergies. Latex and tryptase level were still pending. Patient wants to know if we can test for the chemicals that were used in the dentures as currently she is edentulous making it difficult for her to eat. Plan is to go back to [place] to get her permanent teeth placed. Interim history - Clifford material reactivity test to dental products showed some positives and apparently dentist will avoid those items when she is getting her permanent dentures placed. I reviewed records from [facility] and latex and tryptase bloodwork were still pending. For mild symptoms you can take over the counter antihistamines such as Benadryl and monitor symptoms closely. If symptoms worsen or if you have severe symptoms including breathing issues, throat closure, significant swelling, whole body hives, severe diarrhea and vomiting, lightheadedness then inject epinephrine and seek immediate medical care afterwards.  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 No respiratory compromise.  Concerned about mold allergies as the home they are renting is old. Mold and environmental panel was negative as per the [facility] records.  Keep track of  episodes and take pictures.  Discussed smoking cessation. Regarding the mold - gave handout on environmental control measures. Recommend moving out of the home or mold remediation.  From allergy perspective, I do not order mycotoxins IgG testing as that is controversial and utility is questionable. She is seeing an integrative medicine  specialist for this.  Get bloodwork to rule out any other causes.   Rash and other nonspecific skin eruption See below for proper skincare.  If no improvement, recommend dermatology evaluation next.   Elevated blood pressure reading Blood pressure 162/90 and repeat was 150/100. Follow up with PCP regarding this.  Return in about 4 months (around 03/30/2022).  No orders of the defined types were placed in this encounter.  Lab Orders         Tryptase         Latex, IgE         Alpha-Gal Panel         ANA w/Reflex         C1 Esterase Inhibitor         C1 esterase inhibitor, functional         C3 and C4         CBC with Differential/Platelet         Chronic Urticaria         Complement component c1q         Comprehensive metabolic panel         C-reactive protein         Sedimentation rate         Thyroid Cascade Profile         Protein electrophoresis, serum         Protein Electrophoresis, Urine Rflx.         IgE      Diagnostics: None.   Medication List:  Current Outpatient Medications  Medication Sig Dispense Refill   ALPRAZolam (XANAX) 0.5 MG tablet Take 0.5 mg by mouth 3 (three) times daily as needed.     amphetamine-dextroamphetamine (ADDERALL XR) 30 MG 24 hr capsule Take 30 mg by mouth daily.     amphetamine-dextroamphetamine (ADDERALL) 15 MG tablet Take 15 mg by mouth 2 (two) times daily.     busPIRone (BUSPAR) 15 MG tablet Take 15 mg by mouth 2 (two) times daily.     clonazePAM (KLONOPIN) 0.5 MG tablet SMARTSIG:0.5 Tablet(s) By Mouth 1-2 Times Daily PRN     cloNIDine HCl (KAPVAY) 0.1 MG TB12 ER tablet Take by mouth at bedtime.     lamoTRIgine (LAMICTAL) 25 MG tablet Take by mouth.     valACYclovir (VALTREX) 500 MG tablet Take 500 mg by mouth daily as needed (for cold sores).     No current facility-administered medications for this visit.   Allergies: Allergies  Allergen Reactions   Bee Pollen Swelling   Betadine [Povidone Iodine]     BLISTERS    Erythromycin Hives and Nausea And Vomiting   Gris-Peg [Griseofulvin] Swelling   I reviewed her past medical history, social history, family history, and environmental history and no significant changes have been reported from her previous visit.  Review of Systems  Constitutional:  Negative for appetite change, chills, fever and unexpected weight change.  HENT:  Negative for congestion and rhinorrhea.        Facial swelling  Eyes:  Negative for itching.  Respiratory:  Negative for cough, chest tightness, shortness of breath and wheezing.   Cardiovascular:  Negative for chest pain.  Gastrointestinal:  Negative for abdominal pain.  Genitourinary:  Negative for difficulty urinating.  Skin:  Negative for rash.  Neurological:  Negative for headaches.   Objective: BP (!) 150/100    Pulse 86    Temp 98.2 F (36.8 C)    Resp 16    Ht 5\' 6"  (1.676 m)    Wt 158 lb 6.4 oz (71.8 kg)    SpO2 99%    BMI 25.57 kg/m  Body mass index is 25.57 kg/m. Physical Exam Vitals and nursing note reviewed.  Constitutional:      Appearance: Normal appearance. She is well-developed.  HENT:     Head: Normocephalic and atraumatic.     Right Ear: Tympanic membrane and external ear normal.     Left Ear: Tympanic membrane and external ear normal.     Nose: Nose normal.     Mouth/Throat:     Mouth: Mucous membranes are moist.     Pharynx: Oropharynx is clear.  Eyes:     Conjunctiva/sclera: Conjunctivae normal.  Cardiovascular:     Rate and Rhythm: Normal rate and regular rhythm.     Heart sounds: Normal heart sounds. No murmur heard.   No friction rub. No gallop.  Pulmonary:     Effort: Pulmonary effort is normal.     Breath sounds: Normal breath sounds. No wheezing, rhonchi or rales.  Musculoskeletal:     Cervical back: Neck supple.  Skin:    General: Skin is warm.     Findings: Rash present.     Comments: Circular hyperpigmented patches on the lower extremities.  Neurological:     Mental Status: She  is alert and oriented to person, place, and time.  Psychiatric:        Behavior: Behavior normal.   Previous notes and tests were reviewed. The plan was reviewed with the patient/family, and all questions/concerned were addressed.  It was my pleasure to see Shari Stewart today and participate in her care. Please feel free to contact me with any questions or concerns.  Sincerely,  Rexene Alberts, DO Allergy & Immunology  [facility] office: [place] office: [phone]

## 2021-11-30 NOTE — Assessment & Plan Note (Signed)
Past history -  history of allergic reactions and not sure if the trigger was ever identified as she follows with an allergist at [facility]. More recently she had facial/lip swelling and chest flushing after getting her dentures put in [place]. Apparently she required epinephrine, zyrtec, benadryl, famotidine and prednisone. After that event, she kept having recurrence of the swelling and went to the ER and urgent care for this. She had bloodwork drawn which was negative to environmental allergies. Latex and tryptase level were still pending. Patient wants to know if we can test for the chemicals that were used in the dentures as currently she is edentulous making it difficult for her to eat. Plan is to go back to [place] to get her permanent teeth placed. Interim history - Clifford material reactivity test to dental products showed some positives and apparently dentist will avoid those items when she is getting her permanent dentures placed. I reviewed records from [facility] and latex and tryptase bloodwork were still pending.  For mild symptoms you can take over the counter antihistamines such as Benadryl and monitor symptoms closely. If symptoms worsen or if you have severe symptoms including breathing issues, throat closure, significant swelling, whole body hives, severe diarrhea and vomiting, lightheadedness then inject epinephrine and seek immediate medical care afterwards.

## 2021-11-30 NOTE — Assessment & Plan Note (Signed)
Blood pressure 162/90 and repeat was 150/100.  Follow up with PCP regarding this.

## 2021-11-30 NOTE — Patient Instructions (Addendum)
Swelling/rash Keep track of episodes and take pictures.  Not sure of the cause.  Recommend to stop smoking.   Get bloodwork We are ordering labs, so please allow 1-2 weeks for the results to come back. With the newly implemented Cures Act, the labs might be visible to you at the same time that they become visible to me. However, I will not address the results until all of the results are back, so please be patient.  In the meantime, continue recommendations in your patient instructions, including avoidance measures (if applicable), until you hear from me.  Mold Recommend moving out of the home or mold remediation.  See below for environmental control measures.   Skin See below for proper skincare.  If no improvement, recommend dermatology evaluation next.   Follow up in 4 months or sooner if needed.  Please check your blood pressure and follow up with your PCP regarding this.  Mold Control Mold and fungi can grow on a variety of surfaces provided certain temperature and moisture conditions exist.  Outdoor molds grow on plants, decaying vegetation and soil. The major outdoor mold, Alternaria and Cladosporium, are found in very high numbers during hot and dry conditions. Generally, a late summer - fall peak is seen for common outdoor fungal spores. Rain will temporarily lower outdoor mold spore count, but counts rise rapidly when the rainy period ends. The most important indoor molds are Aspergillus and Penicillium. Dark, humid and poorly ventilated basements are ideal sites for mold growth. The next most common sites of mold growth are the bathroom and the kitchen. Outdoor (Seasonal) Mold Control Use air conditioning and keep windows closed. Avoid exposure to decaying vegetation. Avoid leaf raking. Avoid grain handling. Consider wearing a face mask if working in moldy areas.  Indoor (Perennial) Mold Control  Maintain humidity below 50%. Get rid of mold growth on hard surfaces with  water, detergent and, if necessary, 5% bleach (do not mix with other cleaners). Then dry the area completely. If mold covers an area more than 10 square feet, consider hiring an indoor environmental professional. For clothing, washing with soap and water is best. If moldy items cannot be cleaned and dried, throw them away. Remove sources e.g. contaminated carpets. Repair and seal leaking roofs or pipes. Using dehumidifiers in damp basements may be helpful, but empty the water and clean units regularly to prevent mildew from forming. All rooms, especially basements, bathrooms and kitchens, require ventilation and cleaning to deter mold and mildew growth. Avoid carpeting on concrete or damp floors, and storing items in damp areas.  Skin care recommendations  Bath time: Always use lukewarm water. AVOID very hot or cold water. Keep bathing time to 5-10 minutes. Do NOT use bubble bath. Use a mild soap and use just enough to wash the dirty areas. Do NOT scrub skin vigorously.  After bathing, pat dry your skin with a towel. Do NOT rub or scrub the skin.  Moisturizers and prescriptions:  ALWAYS apply moisturizers immediately after bathing (within 3 minutes). This helps to lock-in moisture. Use the moisturizer several times a day over the whole body. Good summer moisturizers include: Aveeno, CeraVe, Cetaphil. Good winter moisturizers include: Aquaphor, Vaseline, Cerave, Cetaphil, Eucerin, Vanicream. When using moisturizers along with medications, the moisturizer should be applied about one hour after applying the medication to prevent diluting effect of the medication or moisturize around where you applied the medications. When not using medications, the moisturizer can be continued twice daily as maintenance.  Laundry and  clothing: Avoid laundry products with added color or perfumes. Use unscented hypo-allergenic laundry products such as Tide free, Cheer free & gentle, and All free and clear.  If  the skin still seems dry or sensitive, you can try double-rinsing the clothes. Avoid tight or scratchy clothing such as wool. Do not use fabric softeners or dyer sheets.

## 2021-12-06 ENCOUNTER — Encounter: Payer: Self-pay | Admitting: Allergy

## 2021-12-08 LAB — ALPHA-GAL PANEL
Allergen Lamb IgE: <0.1 kU/L
Beef IgE: <0.1 kU/L
IgE (Immunoglobulin E), Serum: 79 IU/mL (ref 6–495)
O215-IgE Alpha-Gal: <0.1 kU/L
Pork IgE: <0.1 kU/L

## 2021-12-08 LAB — COMPREHENSIVE METABOLIC PANEL
ALT: 13 IU/L (ref 0–32)
AST: 14 IU/L (ref 0–40)
Albumin/Globulin Ratio: 2.1 (ref 1.2–2.2)
Albumin: 4.6 g/dL (ref 3.8–4.8)
Alkaline Phosphatase: 97 IU/L (ref 44–121)
BUN/Creatinine Ratio: 14 (ref 9–23)
BUN: 13 mg/dL (ref 6–24)
Bilirubin Total: 0.2 mg/dL (ref 0.0–1.2)
CO2: 20 mmol/L (ref 20–29)
Calcium: 9.2 mg/dL (ref 8.7–10.2)
Chloride: 104 mmol/L (ref 96–106)
Creatinine, Ser: 0.93 mg/dL (ref 0.57–1.00)
Globulin, Total: 2.2 g/dL (ref 1.5–4.5)
Glucose: 81 mg/dL (ref 70–99)
Potassium: 3.8 mmol/L (ref 3.5–5.2)
Sodium: 141 mmol/L (ref 134–144)
Total Protein: 6.8 g/dL (ref 6.0–8.5)
eGFR: 76 mL/min/{1.73_m2} (ref >59)

## 2021-12-08 LAB — C1 ESTERASE INHIBITOR, FUNCTIONAL: C1INH Functional/C1INH Total MFr SerPl: >93 %mean normal

## 2021-12-08 LAB — COMPLEMENT COMPONENT C1Q: Complement C1Q: 11.2 mg/dL (ref 10.3–20.5)

## 2021-12-08 LAB — PROTEIN ELECTROPHORESIS, URINE REFLEX
Albumin ELP, Urine: 22.1 %
Alpha-1-Globulin, U: 7.3 %
Alpha-2-Globulin, U: 19 %
Beta Globulin, U: 33.5 %
Gamma Globulin, U: 18 %
Protein, Ur: 23.3 mg/dL

## 2021-12-08 LAB — PROTEIN ELECTROPHORESIS, SERUM
A/G Ratio: 1.2 (ref 0.7–1.7)
Albumin ELP: 3.7 g/dL (ref 2.9–4.4)
Alpha 1: 0.2 g/dL (ref 0.0–0.4)
Alpha 2: 0.8 g/dL (ref 0.4–1.0)
Beta: 1.1 g/dL (ref 0.7–1.3)
Gamma Globulin: 0.9 g/dL (ref 0.4–1.8)
Globulin, Total: 3.1 g/dL (ref 2.2–3.9)

## 2021-12-08 LAB — CHRONIC URTICARIA: cu index: 2.9 (ref <10)

## 2021-12-08 LAB — CBC WITH DIFFERENTIAL/PLATELET
Basophils Absolute: 0 10*3/uL (ref 0.0–0.2)
Basos: 1 %
EOS (ABSOLUTE): 0.1 10*3/uL (ref 0.0–0.4)
Eos: 2 %
Hematocrit: 43.5 % (ref 34.0–46.6)
Hemoglobin: 14.3 g/dL (ref 11.1–15.9)
Immature Grans (Abs): 0 10*3/uL (ref 0.0–0.1)
Immature Granulocytes: 0 %
Lymphocytes Absolute: 2.1 10*3/uL (ref 0.7–3.1)
Lymphs: 37 %
MCH: 27.9 pg (ref 26.6–33.0)
MCHC: 32.9 g/dL (ref 31.5–35.7)
MCV: 85 fL (ref 79–97)
Monocytes Absolute: 0.3 10*3/uL (ref 0.1–0.9)
Monocytes: 5 %
Neutrophils Absolute: 3.1 10*3/uL (ref 1.4–7.0)
Neutrophils: 55 %
Platelets: 246 10*3/uL (ref 150–450)
RBC: 5.13 x10E6/uL (ref 3.77–5.28)
RDW: 13.3 % (ref 11.7–15.4)
WBC: 5.6 10*3/uL (ref 3.4–10.8)

## 2021-12-08 LAB — THYROID CASCADE PROFILE: TSH: 1.08 u[IU]/mL (ref 0.450–4.500)

## 2021-12-08 LAB — C-REACTIVE PROTEIN: CRP: <1 mg/L (ref 0–10)

## 2021-12-08 LAB — TRYPTASE: Tryptase: 12.1 ug/L (ref 2.2–13.2)

## 2021-12-08 LAB — ANA W/REFLEX: Anti Nuclear Antibody (ANA): NEGATIVE

## 2021-12-08 LAB — C3 AND C4
Complement C3, Serum: 134 mg/dL (ref 82–167)
Complement C4, Serum: 24 mg/dL (ref 12–38)

## 2021-12-08 LAB — C1 ESTERASE INHIBITOR: C1INH SerPl-mCnc: 39 mg/dL (ref 21–39)

## 2021-12-08 LAB — SEDIMENTATION RATE: Sed Rate: 6 mm/hr (ref 0–32)

## 2021-12-08 LAB — LATEX, IGE: Latex IgE: <0.1 kU/L

## 2022-03-16 ENCOUNTER — Ambulatory Visit (INDEPENDENT_AMBULATORY_CARE_PROVIDER_SITE_OTHER): Payer: Medicaid Other

## 2022-03-16 ENCOUNTER — Ambulatory Visit (HOSPITAL_COMMUNITY)
Admission: EM | Admit: 2022-03-16 | Discharge: 2022-03-16 | Disposition: A | Discharge status: Home / Self Care | Payer: Medicaid Other | Attending: Family Medicine | Admitting: Family Medicine

## 2022-03-16 DIAGNOSIS — W19XXXA Unspecified fall, initial encounter: Secondary | ICD-10-CM

## 2022-03-16 DIAGNOSIS — R0781 Pleurodynia: Secondary | ICD-10-CM

## 2022-03-16 NOTE — Discharge Instructions (Addendum)
You were seen today for rib injury.  ?Your xray was negative for fracture.  ?You likely just have bruised ribs.  ?I recommend motrin '600mg'$  up to four times/day for pain, as well has the use of heat and ice.  ?This can take up to four weeks to heal.   ?If you have worsening symptoms then please return for re-evaluation.  ?

## 2022-03-16 NOTE — ED Provider Notes (Signed)
?[place] ? ? ? ?CSN: [id number] ?Arrival date & time: 03/16/22  3664 ? ? ?  ? ?History   ?Chief Complaint ?Chief Complaint  ?Patient presents with  ? Rib Injury  ?  25 M bathtub definitely bruised area under left breast - Entered by patient  ? ? ?HPI ?Shari Stewart is a 46 y.o. female.  ? ?Yesterday evening she fell while in the bath tub.  She landed at the left rib under the breast.  ?Painful to breath deep, cough.  + bruising, very tender.  ?Used ice.  Painful to lay flat.  ? ?Past Medical History:  ?Diagnosis Date  ? Allergy   ? Angio-edema   ? 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 Medical history non-contributory   ? ? ?Patient Active Problem List  ? Diagnosis Date Noted  ? 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 Rash and other nonspecific skin eruption 11/30/2021  ? Elevated blood pressure reading 11/30/2021  ? Allergic reaction 09/20/2021  ? ? ?Past Surgical History:  ?Procedure Laterality Date  ? BARTHOLIN CYST MARSUPIALIZATION Right 06/01/2013  ? Procedure: BARTHOLIN CYST MARSUPIALIZATION;  Surgeon: Princess Bruins, MD;  Location: [facility];  Service: Gynecology;  Laterality: Right;  ? CERVICAL BIOPSY  W/ LOOP ELECTRODE EXCISION  1991  ? CESAREAN SECTION    ? ? ?OB History   ? ? Gravida  ?2  ? Para  ?1  ? Term  ?1  ? Preterm  ?   ? AB  ?   ? Living  ?1  ?  ? ? SAB  ?   ? IAB  ?   ? Ectopic  ?   ? Multiple  ?   ? Live Births  ?   ?   ?  ?  ? ? ? ?Home Medications   ? ?Prior to Admission medications   ?Medication Sig Start Date End Date Taking? Authorizing Provider  ?ALPRAZolam (XANAX) 0.5 MG tablet Take 0.5 mg by mouth 3 (three) times daily as needed. 06/24/21   [provider]  ?amphetamine-dextroamphetamine (ADDERALL XR) 30 MG 24 hr capsule Take 30 mg by mouth daily.    [provider]  ?amphetamine-dextroamphetamine (ADDERALL) 15 MG tablet Take 15 mg by mouth 2 (two) times daily.    [provider]  ?busPIRone (BUSPAR) 15 MG tablet Take 15 mg by mouth 2 (two) times daily.     [provider]  ?clonazePAM (KLONOPIN) 0.5 MG tablet SMARTSIG:0.5 Tablet(s) By Mouth 1-2 Times Daily PRN 11/24/21   [provider]  ?cloNIDine HCl (KAPVAY) 0.1 MG TB12 ER tablet Take by mouth at bedtime.    [provider]  ?lamoTRIgine (LAMICTAL) 25 MG tablet Take by mouth. 11/27/21   [provider]  ?valACYclovir (VALTREX) 500 MG tablet Take 500 mg by mouth daily as needed (for cold sores).    [provider]  ? ? ?Family History ?No family history on file. ? ?Social History ?Social History  ? ?Tobacco Use  ? Smoking status: Every Day  ?  Packs/day: 1.00  ?  Types: Cigarettes  ? Smokeless tobacco: Never  ?Substance Use Topics  ? Alcohol use: Yes  ?  Alcohol/week: 2.0 standard drinks  ?  Types: 2 Glasses of wine per week  ? Drug use: No  ? ? ? ?Allergies   ?Bee pollen, Betadine [povidone iodine], Erythromycin, and Gris-peg [griseofulvin] ? ? ?Review of Systems ?Review of Systems  ?Constitutional: Negative.   ?HENT: Negative.    ?Respiratory:  Negative.    ?Cardiovascular: Negative.   ?Gastrointestinal: Negative.   ?Genitourinary: Negative.   ?Skin: Negative.   ? ? ?Physical Exam ?Triage Vital Signs ?ED Triage Vitals [03/16/22 0848]  ?Enc Vitals Group  ?   BP (!) 138/105  ?   Pulse Rate 83  ?   Resp 18  ?   Temp 98.3 ?F (36.8 ?C)  ?   Temp Source Oral  ?   SpO2 98 %  ?   Weight   ?   Height   ?   Head Circumference   ?   Peak Flow   ?   Pain Score 5  ?   Pain Loc   ?   Pain Edu?   ?   Excl. in [place]?   ? ?No data found. ? ?Updated Vital Signs ?BP (!) 138/105 (BP Location: Left Arm)   Pulse 83   Temp 98.3 ?F (36.8 ?C) (Oral)   Resp 18   SpO2 98%  ? ?Visual Acuity ?Right Eye Distance:   ?Left Eye Distance:   ?Bilateral Distance:   ? ?Right Eye Near:   ?Left Eye Near:    ?Bilateral Near:    ? ?Physical Exam ?Constitutional:   ?   Appearance: Normal appearance.  ?HENT:  ?   Head: Normocephalic.  ?Cardiovascular:  ?   Rate and Rhythm: Normal rate.  ?Pulmonary:  ?   Effort:  Pulmonary effort is normal.  ?   Breath sounds: Normal breath sounds.  ?Musculoskeletal:  ?   Comments: No obvious deformity to the left rib/chest area.  ?TTP to the left anterior chest/rib area just under the left breast  ?Skin: ?   General: Skin is warm.  ?Neurological:  ?   General: No focal deficit present.  ?   Mental Status: She is alert.  ?Psychiatric:     ?   Mood and Affect: Mood normal.  ? ? ? ?UC Treatments / Results  ?Labs ?(all labs ordered are listed, but only abnormal results are displayed) ?Labs Reviewed - No data to display ? ?EKG ? ? ?Radiology ?DG Ribs Unilateral W/Chest Left ? ?Result Date: 03/16/2022 ?CLINICAL DATA:  Left rib pain after fall last night. EXAM: LEFT RIBS AND CHEST - 3+ VIEW COMPARISON:  None Available. FINDINGS: No fracture or other bone lesions are seen involving the ribs. There is no evidence of pneumothorax or pleural effusion. Both lungs are clear. Heart size and mediastinal contours are within normal limits. IMPRESSION: Negative. Electronically Signed   By: Marijo Conception M.D.   On: 03/16/2022 09:15   ? ?Procedures ?Procedures (including critical care time) ? ?Medications Ordered in UC ?Medications - No data to display ? ?Initial Impression / Assessment and Plan / UC Course  ?I have reviewed the triage vital signs and the nursing notes. ? ?Pertinent labs & imaging results that were available during my care of the patient were reviewed by me and considered in my medical decision making (see chart for details). ? ?  ?Final Clinical Impressions(s) / UC Diagnoses  ? ?Final diagnoses:  ?Rib pain on left side  ? ? ? ?Discharge Instructions   ? ?  ?You were seen today for rib injury.  ?Your xray was negative for fracture.  ?You likely just have bruised ribs.  ?I recommend motrin '600mg'$  up to four times/day for pain, as well has the use of heat and ice.  ?This can take up to four weeks to heal.   ?If you  have worsening symptoms then please return for re-evaluation.  ? ? ? ?ED  Prescriptions   ?None ?  ? ?PDMP not reviewed this encounter. ?  Rondel Oh, MD ?03/16/22 [id number] ? ?

## 2022-03-16 NOTE — ED Triage Notes (Signed)
Pt reports slipping in her shower last night. C/o left rib pain.  ?

## 2022-04-02 NOTE — Progress Notes (Deleted)
Follow Up Note  RE: Shari Stewart MRN: [id number] DOB: January 03, 1976 Date of Office Visit: 04/03/2022  Referring provider: Simona Huh, NP Primary care provider: Simona Huh, NP  Chief Complaint: No chief complaint on file.  History of Present Illness: I had the pleasure of seeing Shari Stewart for a follow up visit at the [facility] on 04/02/2022. She is a 46 y.o. female, who is being followed for allergic reaction, angioedema, rash. Her previous allergy office visit was on 11/30/2021 with Dr. Maudie Mercury. Today is a regular follow up visit.  Allergic reaction Past history -  history of allergic reactions and not sure if the trigger was ever identified as she follows with an allergist at [facility]. More recently she had facial/lip swelling and chest flushing after getting her dentures put in [place]. Apparently she required epinephrine, zyrtec, benadryl, famotidine and prednisone. After that event, she kept having recurrence of the swelling and went to the ER and urgent care for this. She had bloodwork drawn which was negative to environmental allergies. Latex and tryptase level were still pending. Patient wants to know if we can test for the chemicals that were used in the dentures as currently she is edentulous making it difficult for her to eat. Plan is to go back to [place] to get her permanent teeth placed. Interim history - Clifford material reactivity test to dental products showed some positives and apparently dentist will avoid those items when she is getting her permanent dentures placed. I reviewed records from [facility] and latex and tryptase bloodwork were still pending. For mild symptoms you can take over the counter antihistamines such as Benadryl and monitor symptoms closely. If symptoms worsen or if you have severe symptoms including breathing issues, throat closure, significant swelling, whole body hives, severe diarrhea and vomiting,  lightheadedness then inject epinephrine and seek immediate medical care afterwards.   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 No respiratory compromise.  Concerned about mold allergies as the home they are renting is old. Mold and environmental panel was negative as per the [facility] records.  Keep track of episodes and take pictures.  Discussed smoking cessation. Regarding the mold - gave handout on environmental control measures. Recommend moving out of the home or mold remediation.  From allergy perspective, I do not order mycotoxins IgG testing as that is controversial and utility is questionable. She is seeing an integrative medicine specialist for this.  Get bloodwork to rule out any other causes.    Rash and other nonspecific skin eruption See below for proper skincare.  If no improvement, recommend dermatology evaluation next.    Elevated blood pressure reading Blood pressure 162/90 and repeat was 150/100. Follow up with PCP regarding this.   Return in about 4 months (around 03/30/2022).  I reviewed the bloodwork. Blood count, kidney function, liver function, electrolytes, thyroid, autoimmune screener, inflammation markers, chronic urticaria index (checks for autoantibodies that trigger mast cells), tryptase (checks for mast cell issues), angioedema panel (checks for swelling issues), urine test, and alpha gal (checks for red meat allergy) were all normal which is great.    Latex Ige was negative.    Based on these results no cause for your symptoms.  Assessment and Plan: [name] is a 46 y.o. female with: No problem-specific Assessment & Plan notes found for this encounter.  No follow-ups on file.  No orders of the defined types were placed in this encounter.  Lab Orders  No laboratory test(s) ordered today    Diagnostics: Spirometry:  Tracings reviewed. Her effort: {Blank single:19197::"Good reproducible efforts.","It was hard to  get consistent efforts and there is a question as to whether this reflects a maximal maneuver.","Poor effort, data can not be interpreted."} FVC: ***L FEV1: ***L, ***% predicted FEV1/FVC ratio: ***% Interpretation: {Blank single:19197::"Spirometry consistent with mild obstructive disease","Spirometry consistent with moderate obstructive disease","Spirometry consistent with severe obstructive disease","Spirometry consistent with possible restrictive disease","Spirometry consistent with mixed obstructive and restrictive disease","Spirometry uninterpretable due to technique","Spirometry consistent with normal pattern","No overt abnormalities noted given today's efforts"}.  Please see scanned spirometry results for details.  Skin Testing: {Blank single:19197::"Select foods","Environmental allergy panel","Environmental allergy panel and select foods","Food allergy panel","None","Deferred due to recent antihistamines use"}. *** Results discussed with patient/family.   Medication List:  Current Outpatient Medications  Medication Sig Dispense Refill   ALPRAZolam (XANAX) 0.5 MG tablet Take 0.5 mg by mouth 3 (three) times daily as needed.     amphetamine-dextroamphetamine (ADDERALL XR) 30 MG 24 hr capsule Take 30 mg by mouth daily.     amphetamine-dextroamphetamine (ADDERALL) 15 MG tablet Take 15 mg by mouth 2 (two) times daily.     busPIRone (BUSPAR) 15 MG tablet Take 15 mg by mouth 2 (two) times daily.     clonazePAM (KLONOPIN) 0.5 MG tablet SMARTSIG:0.5 Tablet(s) By Mouth 1-2 Times Daily PRN     cloNIDine HCl (KAPVAY) 0.1 MG TB12 ER tablet Take by mouth at bedtime.     lamoTRIgine (LAMICTAL) 25 MG tablet Take by mouth.     valACYclovir (VALTREX) 500 MG tablet Take 500 mg by mouth daily as needed (for cold sores).     No current facility-administered medications for this visit.   Allergies: Allergies  Allergen Reactions   Bee Pollen Swelling   Betadine [Povidone Iodine]     BLISTERS    Erythromycin Hives and Nausea And Vomiting   Gris-Peg [Griseofulvin] Swelling   I reviewed her past medical history, social history, family history, and environmental history and no significant changes have been reported from her previous visit.  Review of Systems  Constitutional:  Negative for appetite change, chills, fever and unexpected weight change.  HENT:  Negative for congestion and rhinorrhea.        Facial swelling  Eyes:  Negative for itching.  Respiratory:  Negative for cough, chest tightness, shortness of breath and wheezing.   Cardiovascular:  Negative for chest pain.  Gastrointestinal:  Negative for abdominal pain.  Genitourinary:  Negative for difficulty urinating.  Skin:  Negative for rash.  Neurological:  Negative for headaches.   Objective: There were no vitals taken for this visit. There is no height or weight on file to calculate BMI. Physical Exam Vitals and nursing note reviewed.  Constitutional:      Appearance: Normal appearance. She is well-developed.  HENT:     Head: Normocephalic and atraumatic.     Right Ear: Tympanic membrane and external ear normal.     Left Ear: Tympanic membrane and external ear normal.     Nose: Nose normal.     Mouth/Throat:     Mouth: Mucous membranes are moist.     Pharynx: Oropharynx is clear.  Eyes:     Conjunctiva/sclera: Conjunctivae normal.  Cardiovascular:     Rate and Rhythm: Normal rate and regular rhythm.     Heart sounds: Normal heart sounds. No murmur heard.   No friction rub. No gallop.  Pulmonary:     Effort: Pulmonary  effort is normal.     Breath sounds: Normal breath sounds. No wheezing, rhonchi or rales.  Musculoskeletal:     Cervical back: Neck supple.  Skin:    General: Skin is warm.     Findings: Rash present.     Comments: Circular hyperpigmented patches on the lower extremities.  Neurological:     Mental Status: She is alert and oriented to person, place, and time.  Psychiatric:         Behavior: Behavior normal.   Previous notes and tests were reviewed. The plan was reviewed with the patient/family, and all questions/concerned were addressed.  It was my pleasure to see Annalia today and participate in her care. Please feel free to contact me with any questions or concerns.  Sincerely,  Rexene Alberts, DO Allergy & Immunology  [facility] office: [place] office: [phone]

## 2022-04-03 ENCOUNTER — Ambulatory Visit: Payer: Medicaid Other | Admitting: Allergy

## 2022-04-03 DIAGNOSIS — T783XXD Angioneurotic edema, subsequent encounter: Secondary | ICD-10-CM

## 2022-04-03 DIAGNOSIS — R21 Rash and other nonspecific skin eruption: Secondary | ICD-10-CM

## 2022-04-03 DIAGNOSIS — T7840XD Allergy, unspecified, subsequent encounter: Secondary | ICD-10-CM

## 2022-04-13 ENCOUNTER — Encounter: Payer: Medicaid Other | Admitting: Nurse Practitioner

## 2022-07-17 ENCOUNTER — Telehealth: Payer: Medicaid Other | Admitting: Family Medicine

## 2022-07-17 ENCOUNTER — Encounter: Payer: Self-pay | Admitting: Family Medicine

## 2022-07-17 DIAGNOSIS — R2231 Localized swelling, mass and lump, right upper limb: Secondary | ICD-10-CM

## 2022-07-17 NOTE — Progress Notes (Signed)
[place]   Will be going to be seen in person for questionable "boil/infection/wart" of finger(s)  Patient acknowledged agreement and understanding of the plan.

## 2022-11-16 IMAGING — DX DG RIBS W/ CHEST 3+V*L*
4 series · 4 of 4 positions shown · non-contrast
Comparison: None Available.

CLINICAL DATA: Left rib pain after fall last night.

EXAM:
LEFT RIBS AND CHEST - 3+ VIEW

[chest pa]
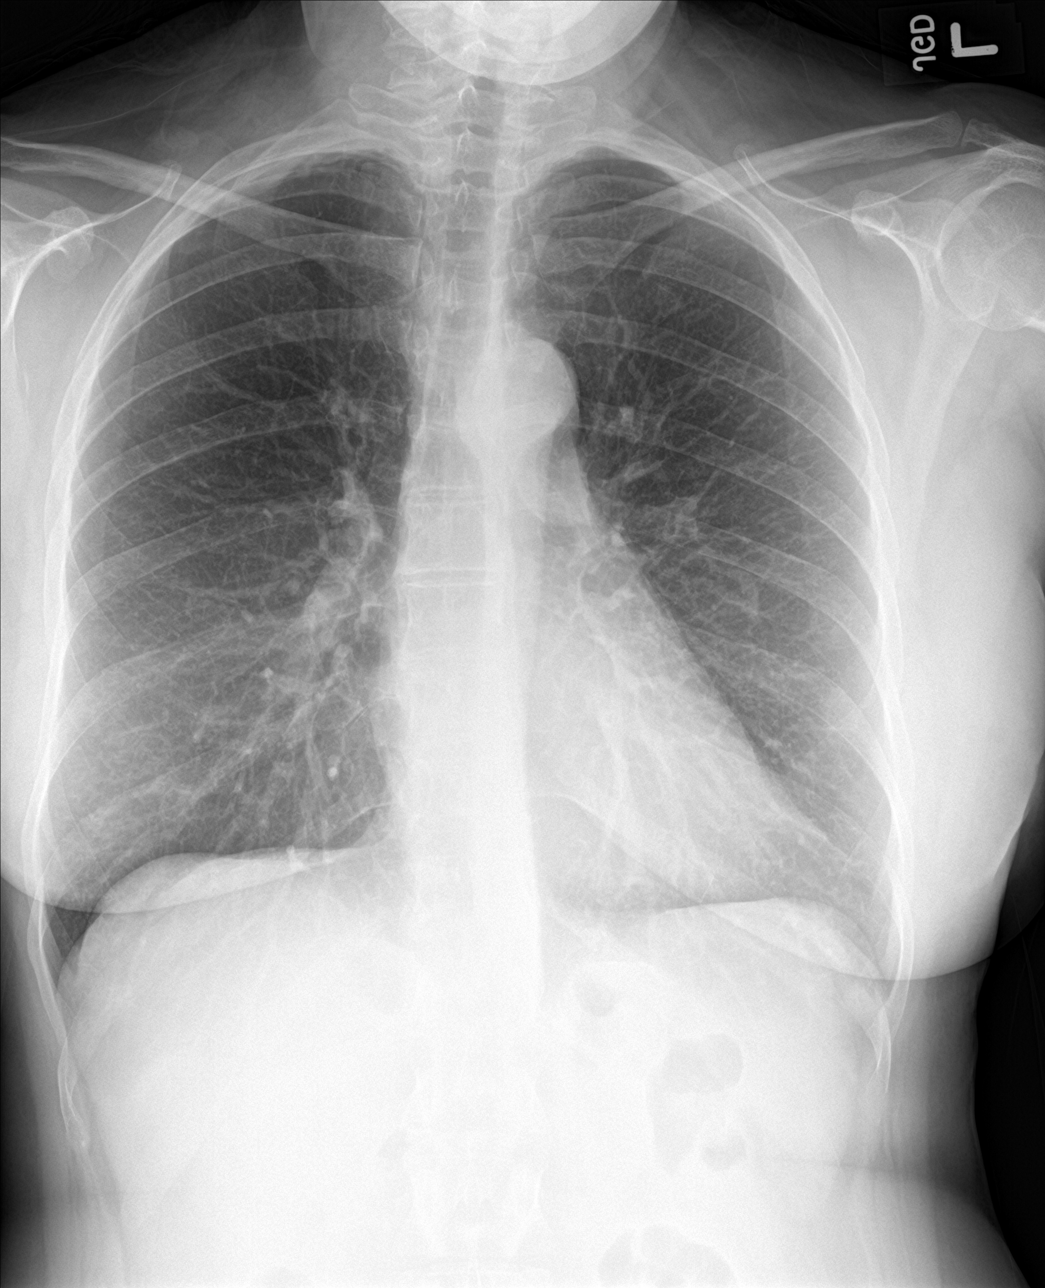

[rib obl (1 of 2)]
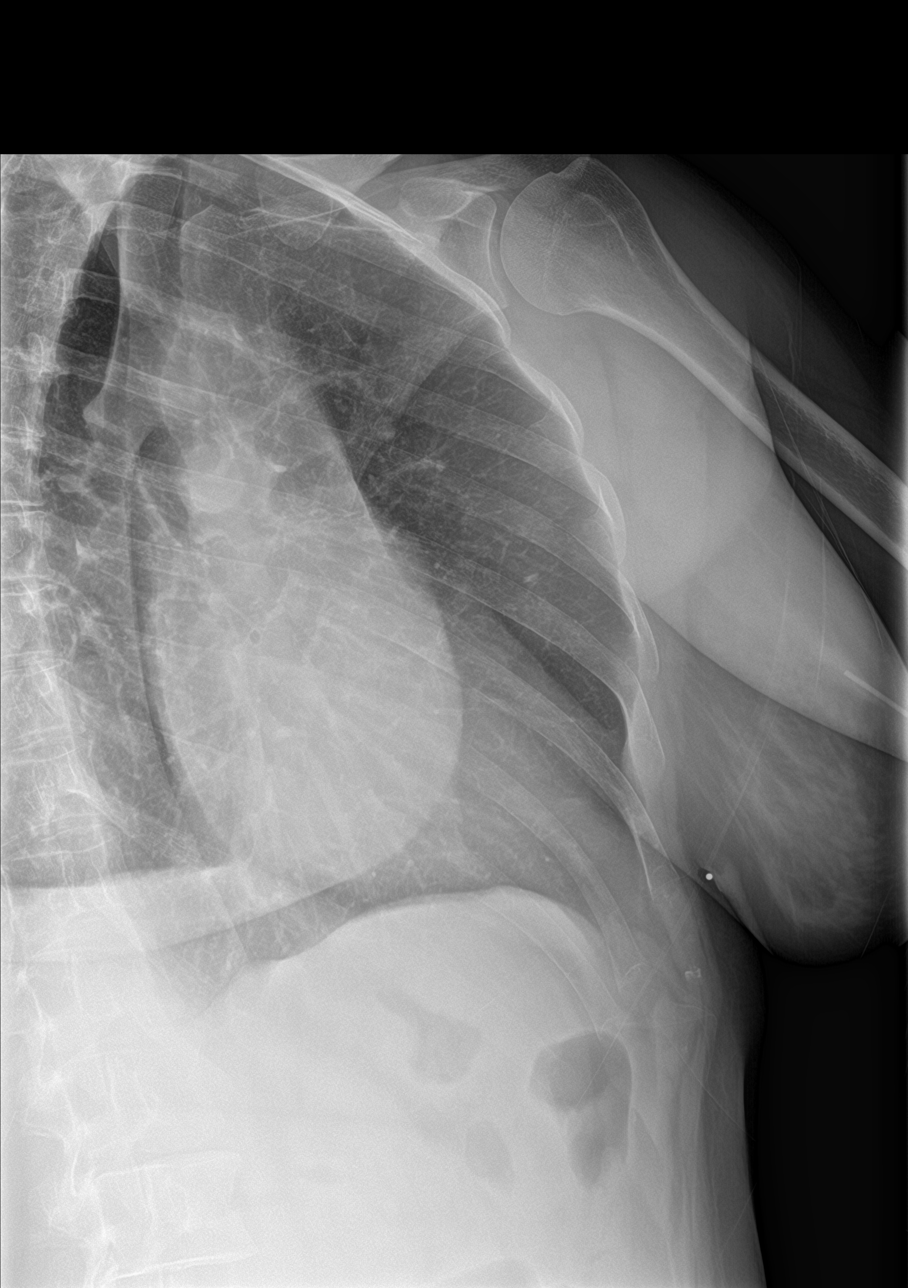

[rib obl (2 of 2)]
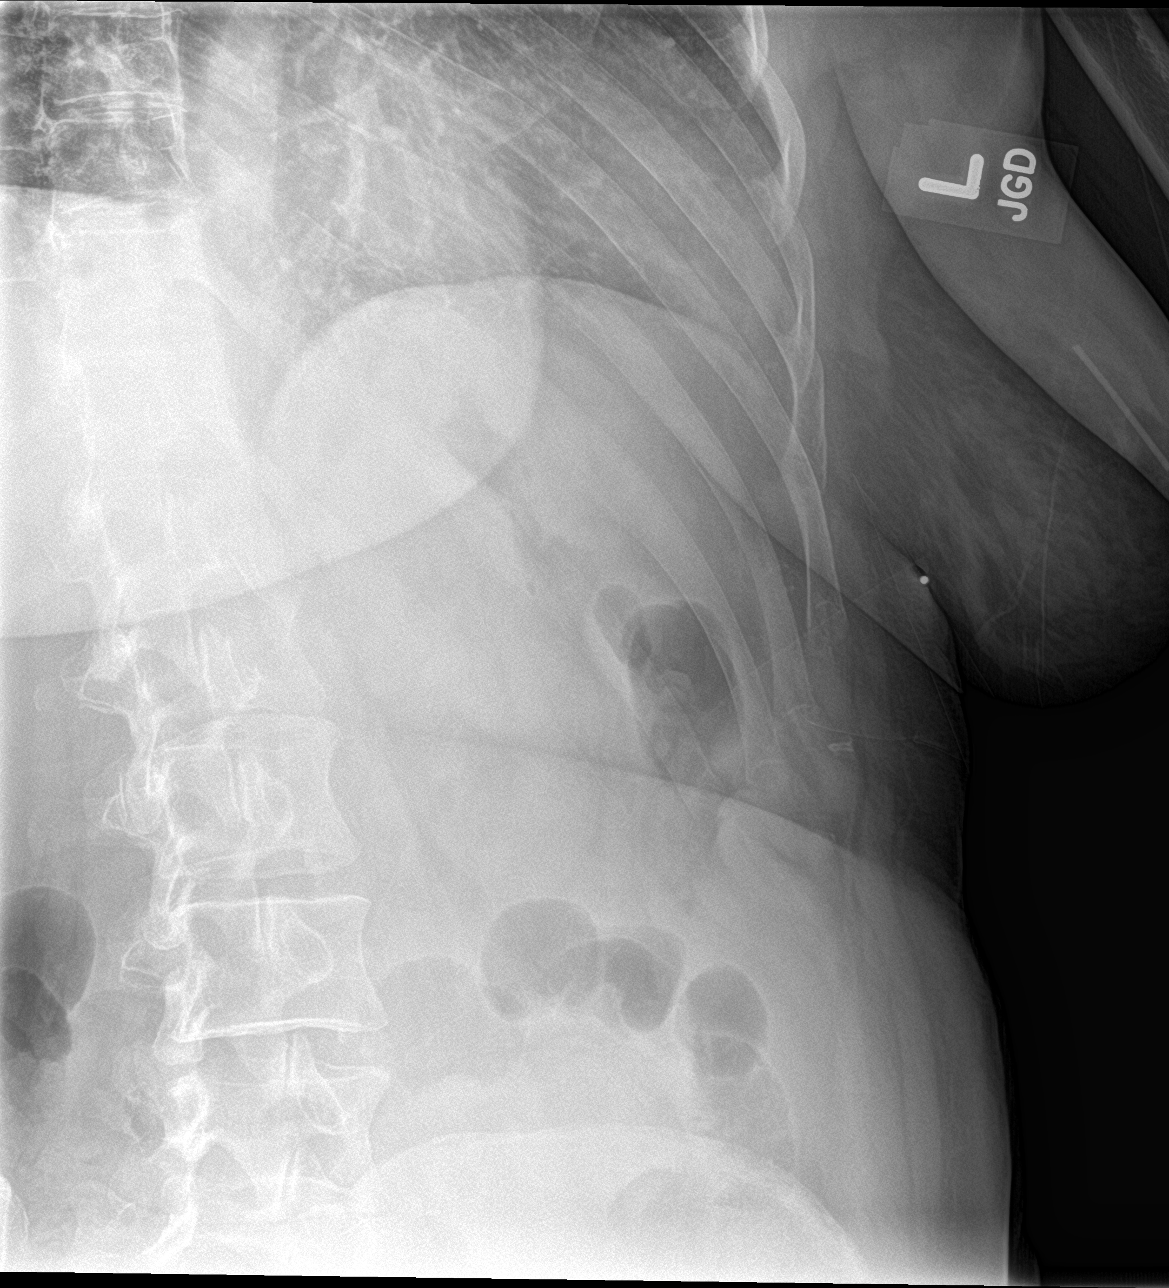

[rib ap]
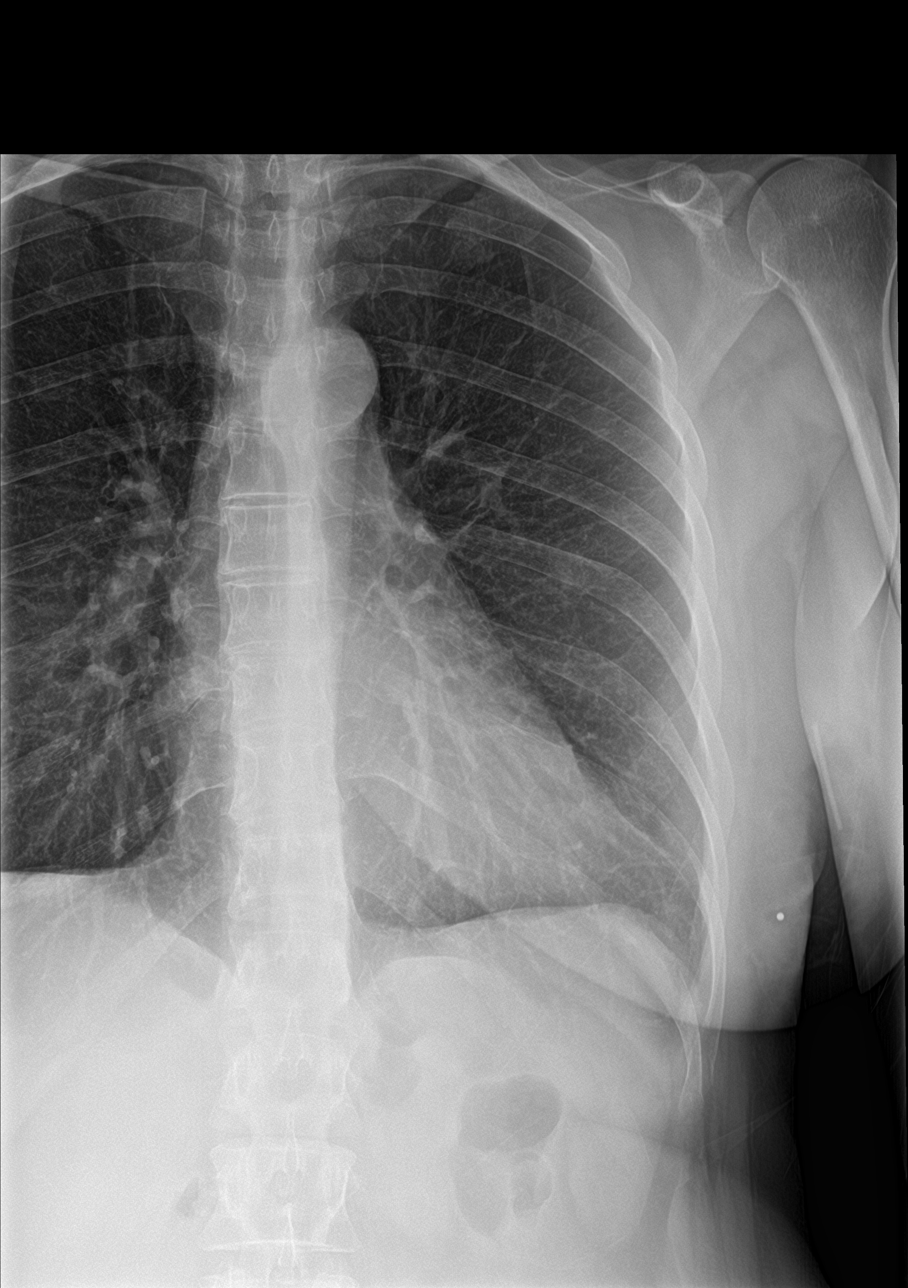

[4 of 4 positions shown; findings below may reference images not displayed]

FINDINGS: No fracture or other bone lesions are seen involving the ribs. There
is no evidence of pneumothorax or pleural effusion. Both lungs are
clear. Heart size and mediastinal contours are within normal limits.
IMPRESSION: Negative.

## 2023-03-15 ENCOUNTER — Other Ambulatory Visit: Payer: Self-pay

## 2023-03-15 ENCOUNTER — Encounter (HOSPITAL_BASED_OUTPATIENT_CLINIC_OR_DEPARTMENT_OTHER): Payer: Self-pay | Admitting: Orthopedic Surgery

## 2023-03-21 NOTE — Progress Notes (Signed)

## 2023-03-22 ENCOUNTER — Ambulatory Visit (HOSPITAL_BASED_OUTPATIENT_CLINIC_OR_DEPARTMENT_OTHER)
Admission: RE | Admit: 2023-03-22 | Discharge: 2023-03-22 | Disposition: A | Discharge status: Home / Self Care | Payer: Medicaid Other | Source: Ambulatory Visit | Attending: Orthopedic Surgery | Admitting: Orthopedic Surgery

## 2023-03-22 ENCOUNTER — Ambulatory Visit (HOSPITAL_BASED_OUTPATIENT_CLINIC_OR_DEPARTMENT_OTHER): Payer: Medicaid Other | Admitting: Anesthesiology

## 2023-03-22 ENCOUNTER — Encounter (HOSPITAL_BASED_OUTPATIENT_CLINIC_OR_DEPARTMENT_OTHER): Payer: Self-pay | Admitting: Orthopedic Surgery

## 2023-03-22 ENCOUNTER — Other Ambulatory Visit: Payer: Self-pay

## 2023-03-22 ENCOUNTER — Encounter (HOSPITAL_BASED_OUTPATIENT_CLINIC_OR_DEPARTMENT_OTHER)
Admission: RE | Disposition: A | Discharge status: Home / Self Care | Payer: Self-pay | Source: Ambulatory Visit | Attending: Orthopedic Surgery

## 2023-03-22 DIAGNOSIS — M75111 Incomplete rotator cuff tear or rupture of right shoulder, not specified as traumatic: Secondary | ICD-10-CM | POA: Diagnosis not present

## 2023-03-22 DIAGNOSIS — F17210 Nicotine dependence, cigarettes, uncomplicated: Secondary | ICD-10-CM | POA: Insufficient documentation

## 2023-03-22 DIAGNOSIS — F419 Anxiety disorder, unspecified: Secondary | ICD-10-CM

## 2023-03-22 DIAGNOSIS — M7501 Adhesive capsulitis of right shoulder: Secondary | ICD-10-CM | POA: Insufficient documentation

## 2023-03-22 DIAGNOSIS — M25811 Other specified joint disorders, right shoulder: Secondary | ICD-10-CM | POA: Insufficient documentation

## 2023-03-22 DIAGNOSIS — M19011 Primary osteoarthritis, right shoulder: Secondary | ICD-10-CM | POA: Insufficient documentation

## 2023-03-22 DIAGNOSIS — M7541 Impingement syndrome of right shoulder: Secondary | ICD-10-CM

## 2023-03-22 DIAGNOSIS — S43431A Superior glenoid labrum lesion of right shoulder, initial encounter: Secondary | ICD-10-CM

## 2023-03-22 HISTORY — PX: SHOULDER ARTHROSCOPY WITH DISTAL CLAVICLE RESECTION: SHX5675

## 2023-03-22 HISTORY — PX: SHOULDER ARTHROSCOPY WITH SUBACROMIAL DECOMPRESSION: SHX5684

## 2023-03-22 LAB — POCT PREGNANCY, URINE: Preg Test, Ur: NEGATIVE

## 2023-03-22 SURGERY — SHOULDER ARTHROSCOPY WITH DISTAL CLAVICLE RESECTION
Anesthesia: Regional | Site: Shoulder | Laterality: Right

## 2023-03-22 MED ORDER — FENTANYL CITRATE (PF) 100 MCG/2ML IJ SOLN
INTRAMUSCULAR | Status: AC
  Filled 2023-03-22: qty 2

## 2023-03-22 MED ORDER — LACTATED RINGERS IV SOLN: INTRAVENOUS | Status: DC

## 2023-03-22 MED ORDER — ONDANSETRON HCL 4 MG/2ML IJ SOLN
INTRAMUSCULAR | Status: DC | PRN
  Administered 2023-03-22: 4 mg via INTRAVENOUS

## 2023-03-22 MED ORDER — OXYCODONE HCL 5 MG PO TABS
ORAL_TABLET | ORAL | Status: AC
  Filled 2023-03-22: qty 1

## 2023-03-22 MED ORDER — CEFAZOLIN SODIUM-DEXTROSE 2-4 GM/100ML-% IV SOLN
INTRAVENOUS | Status: AC
  Filled 2023-03-22: qty 100

## 2023-03-22 MED ORDER — BUPIVACAINE-EPINEPHRINE (PF) 0.5% -1:200000 IJ SOLN
INTRAMUSCULAR | Status: DC | PRN
  Administered 2023-03-22: 15 mL via PERINEURAL

## 2023-03-22 MED ORDER — SUGAMMADEX SODIUM 200 MG/2ML IV SOLN
INTRAVENOUS | Status: DC | PRN
  Administered 2023-03-22: 200 mg via INTRAVENOUS

## 2023-03-22 MED ORDER — FENTANYL CITRATE (PF) 100 MCG/2ML IJ SOLN
50.0000 ug | Freq: Once | INTRAMUSCULAR | Status: AC
  Administered 2023-03-22: 50 ug via INTRAVENOUS

## 2023-03-22 MED ORDER — FENTANYL CITRATE (PF) 100 MCG/2ML IJ SOLN
25.0000 ug | INTRAMUSCULAR | Status: DC | PRN
  Administered 2023-03-22: 25 ug via INTRAVENOUS

## 2023-03-22 MED ORDER — ONDANSETRON HCL 4 MG PO TABS: 4.0000 mg | ORAL_TABLET | Freq: Three times a day (TID) | ORAL | 0 refills | Status: DC | PRN

## 2023-03-22 MED ORDER — ACETAMINOPHEN 500 MG PO TABS
ORAL_TABLET | ORAL | Status: AC
  Filled 2023-03-22: qty 2

## 2023-03-22 MED ORDER — OXYCODONE HCL 5 MG PO TABS
5.0000 mg | ORAL_TABLET | Freq: Once | ORAL | Status: AC | PRN
  Administered 2023-03-22: 5 mg via ORAL

## 2023-03-22 MED ORDER — HYDROCODONE-ACETAMINOPHEN 5-325 MG PO TABS: 1.0000 | ORAL_TABLET | ORAL | 0 refills | Status: DC | PRN

## 2023-03-22 MED ORDER — BUPIVACAINE LIPOSOME 1.3 % IJ SUSP
INTRAMUSCULAR | Status: DC | PRN
  Administered 2023-03-22: 10 mL via PERINEURAL

## 2023-03-22 MED ORDER — LIDOCAINE HCL (CARDIAC) PF 100 MG/5ML IV SOSY
PREFILLED_SYRINGE | INTRAVENOUS | Status: DC | PRN
  Administered 2023-03-22: 100 mg via INTRAVENOUS

## 2023-03-22 MED ORDER — ROCURONIUM BROMIDE 100 MG/10ML IV SOLN
INTRAVENOUS | Status: DC | PRN
  Administered 2023-03-22: 50 mg via INTRAVENOUS

## 2023-03-22 MED ORDER — CEFAZOLIN SODIUM-DEXTROSE 2-4 GM/100ML-% IV SOLN
2.0000 g | INTRAVENOUS | Status: AC
  Administered 2023-03-22: 2 g via INTRAVENOUS

## 2023-03-22 MED ORDER — AMISULPRIDE (ANTIEMETIC) 5 MG/2ML IV SOLN: 10.0000 mg | Freq: Once | INTRAVENOUS | Status: DC | PRN

## 2023-03-22 MED ORDER — OXYCODONE HCL 5 MG/5ML PO SOLN: 5.0000 mg | Freq: Once | ORAL | Status: AC | PRN

## 2023-03-22 MED ORDER — DEXAMETHASONE SODIUM PHOSPHATE 4 MG/ML IJ SOLN
INTRAMUSCULAR | Status: DC | PRN
  Administered 2023-03-22: 5 mg via INTRAVENOUS

## 2023-03-22 MED ORDER — SODIUM CHLORIDE 0.9 % IR SOLN
Status: DC | PRN
  Administered 2023-03-22: 6000 mL

## 2023-03-22 MED ORDER — MIDAZOLAM HCL 2 MG/2ML IJ SOLN
2.0000 mg | Freq: Once | INTRAMUSCULAR | Status: AC
  Administered 2023-03-22: 2 mg via INTRAVENOUS

## 2023-03-22 MED ORDER — PROPOFOL 10 MG/ML IV BOLUS
INTRAVENOUS | Status: DC | PRN
  Administered 2023-03-22: 200 mg via INTRAVENOUS

## 2023-03-22 MED ORDER — ACETAMINOPHEN 500 MG PO TABS
1000.0000 mg | ORAL_TABLET | Freq: Once | ORAL | Status: AC
  Administered 2023-03-22: 1000 mg via ORAL

## 2023-03-22 MED ORDER — MIDAZOLAM HCL 2 MG/2ML IJ SOLN
INTRAMUSCULAR | Status: AC
  Filled 2023-03-22: qty 2

## 2023-03-22 SURGICAL SUPPLY — 68 items
APL PRP STRL LF DISP 70% ISPRP (MISCELLANEOUS) ×1
BLADE EXCALIBUR 4.0X13 (MISCELLANEOUS) ×1 IMPLANT
BLADE SHAVER BONE 5.0X13 (MISCELLANEOUS) IMPLANT
BNDG CMPR 5X4 CHSV STRCH STRL (GAUZE/BANDAGES/DRESSINGS)
BNDG COHESIVE 4X5 TAN STRL LF (GAUZE/BANDAGES/DRESSINGS) IMPLANT
BUR SURG 4D 13L RD FLUTE (BUR) IMPLANT
BURR OVAL 8 FLU 4.0X13 (MISCELLANEOUS) IMPLANT
BURR SURG 4D 13L RD FLUTE (BUR)
CANNULA 5.75X71 LONG (CANNULA) IMPLANT
CANNULA TWIST IN 8.25X7CM (CANNULA) IMPLANT
CHLORAPREP W/TINT 26 (MISCELLANEOUS) IMPLANT
COOLER ICEMAN CLASSIC (MISCELLANEOUS) IMPLANT
CUTTER BONE 4.0MM X 13CM (MISCELLANEOUS) IMPLANT
DISSECTOR  3.8MM X 13CM (MISCELLANEOUS)
DISSECTOR 3.8MM X 13CM (MISCELLANEOUS) IMPLANT
DRAPE INCISE 23X17 STRL (DRAPES) IMPLANT
DRAPE INCISE IOBAN 23X17 STRL (DRAPES) ×1 IMPLANT
DRAPE INCISE IOBAN 66X45 STRL (DRAPES) ×1 IMPLANT
DRAPE STERI 35X30 U-POUCH (DRAPES) ×1 IMPLANT
DRAPE SURG 17X23 STRL (DRAPES) ×1 IMPLANT
DRAPE U-SHAPE 47X51 STRL (DRAPES) ×1 IMPLANT
DRAPE U-SHAPE 76X120 STRL (DRAPES) ×2 IMPLANT
DURAPREP 26ML APPLICATOR (WOUND CARE) ×1 IMPLANT
GAUZE PAD ABD 8X10 STRL (GAUZE/BANDAGES/DRESSINGS) ×2 IMPLANT
GAUZE SPONGE 4X4 12PLY STRL (GAUZE/BANDAGES/DRESSINGS) ×1 IMPLANT
GAUZE XEROFORM 1X8 LF (GAUZE/BANDAGES/DRESSINGS) IMPLANT
GLOVE BIO SURGEON STRL SZ7.5 (GLOVE) ×2 IMPLANT
GLOVE BIOGEL PI IND STRL 8 (GLOVE) ×2 IMPLANT
GOWN STRL REUS W/ TWL LRG LVL3 (GOWN DISPOSABLE) ×1 IMPLANT
GOWN STRL REUS W/TWL LRG LVL3 (GOWN DISPOSABLE) ×1
GOWN STRL REUS W/TWL XL LVL3 (GOWN DISPOSABLE) ×2 IMPLANT
LASSO 90 CVE QUICKPAS (DISPOSABLE) IMPLANT
LASSO CRESCENT QUICKPASS (SUTURE) IMPLANT
LOOP 2 FIBERLINK CLOSED (SUTURE) IMPLANT
MANIFOLD NEPTUNE II (INSTRUMENTS) ×1 IMPLANT
NDL HD SCORPION MEGA LOADER (NEEDLE) IMPLANT
NDL SAFETY ECLIP 18X1.5 (MISCELLANEOUS) ×1 IMPLANT
PACK ARTHROSCOPY DSU (CUSTOM PROCEDURE TRAY) ×1 IMPLANT
PACK BASIN DAY SURGERY FS (CUSTOM PROCEDURE TRAY) ×1 IMPLANT
PAD COLD SHLDR WRAP-ON (PAD) ×1 IMPLANT
PAD ORTHO SHOULDER 7X19 LRG (SOFTGOODS) IMPLANT
PORT APPOLLO RF 90DEGREE MULTI (SURGICAL WAND) IMPLANT
PROBE APOLLO 90XL (SURGICAL WAND) IMPLANT
SLEEVE ARM SUSPENSION SYSTEM (MISCELLANEOUS) ×1 IMPLANT
SLEEVE SCD COMPRESS KNEE MED (STOCKING) ×1 IMPLANT
SLING ARM FOAM STRAP LRG (SOFTGOODS) IMPLANT
SLING ARM FOAM STRAP MED (SOFTGOODS) IMPLANT
SLING S3 LATERAL DISP (MISCELLANEOUS) IMPLANT
SPIKE FLUID TRANSFER (MISCELLANEOUS) IMPLANT
STRIP CLOSURE SKIN 1/2X4 (GAUZE/BANDAGES/DRESSINGS) ×1 IMPLANT
SUT 2 FIBERLOOP 20 STRT BLUE (SUTURE)
SUT ETHILON 3 0 PS 1 (SUTURE) IMPLANT
SUT FIBERWIRE #2 38 T-5 BLUE (SUTURE)
SUT MNCRL AB 3-0 PS2 27 (SUTURE) ×1 IMPLANT
SUT PDS AB 0 CT 36 (SUTURE) IMPLANT
SUT TIGER TAPE 7 IN WHITE (SUTURE) IMPLANT
SUT VIC AB 0 CT1 36 (SUTURE) IMPLANT
SUT VIC AB 2-0 CT1 27 (SUTURE)
SUT VIC AB 2-0 CT1 TAPERPNT 27 (SUTURE) IMPLANT
SUTURE 2 FIBERLOOP 20 STRT BLU (SUTURE) IMPLANT
SUTURE FIBERWR #2 38 T-5 BLUE (SUTURE) IMPLANT
SUTURE TAPE TIGERLINK 1.3MM BL (SUTURE) IMPLANT
SUTURETAPE TIGERLINK 1.3MM BL (SUTURE)
SYR 5ML LL (SYRINGE) ×1 IMPLANT
TAPE FIBER 2MM 7IN #2 BLUE (SUTURE) IMPLANT
TOWEL GREEN STERILE FF (TOWEL DISPOSABLE) ×1 IMPLANT
TUBE CONNECTING 20X1/4 (TUBING) ×1 IMPLANT
TUBING ARTHROSCOPY IRRIG 16FT (MISCELLANEOUS) ×1 IMPLANT

## 2023-03-22 NOTE — H&P (Signed)
ORTHOPAEDIC H and P  REQUESTING PHYSICIAN: Yolonda Kida, MD  PCP:  Courtney Paris, NP  Chief Complaint: Right shoulder pain  HPI: Shari Stewart is a 47 y.o. female who complains of right shoulder pain and stiffness.  Here today for arthroscopy of the right shoulder for definitive treatment.  No new complaints at this time.  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   Medical history non-contributory    Past Surgical History:  Procedure Laterality Date   BARTHOLIN CYST MARSUPIALIZATION Right 06/01/2013   Procedure: BARTHOLIN CYST MARSUPIALIZATION;  Surgeon: Genia Del, MD;  Location: [facility];  Service: Gynecology;  Laterality: Right;   CERVICAL BIOPSY  W/ LOOP ELECTRODE EXCISION  11/05/1989   CESAREAN SECTION     DENTAL SURGERY     dental implant surgery   Social History   Socioeconomic History   Marital status: Married    Spouse name: Not on file   Number of children: Not on file   Years of education: Not on file   Highest education level: Not on file  Occupational History   Not on file  Tobacco Use   Smoking status: Every Day    Packs/day: 1    Types: Cigarettes   Smokeless tobacco: Never  Substance and Sexual Activity   Alcohol use: Yes    Alcohol/week: 2.0 standard drinks of alcohol    Types: 2 Glasses of wine per week   Drug use: No   Sexual activity: Yes    Birth control/protection: None  Other Topics Concern   Not on file  Social History Narrative   Not on file   Social Determinants of Health   Financial Resource Strain: Not on file  Food Insecurity: Not on file  Transportation Needs: Not on file  Physical Activity: Not on file  Stress: Not on file  Social Connections: Not on file   History reviewed. No pertinent family history. Allergies  Allergen Reactions   Bee Pollen Swelling   Betadine [Povidone Iodine]     BLISTERS   Erythromycin Hives and Nausea And Vomiting    Gris-Peg [Griseofulvin] Swelling   Other     Polymethyl methacrylate   Prior to Admission medications   Medication Sig Start Date End Date Taking? Authorizing Provider  amphetamine-dextroamphetamine (ADDERALL XR) 30 MG 24 hr capsule Take 30 mg by mouth daily.   Yes [provider]  amphetamine-dextroamphetamine (ADDERALL) 15 MG tablet Take 15 mg by mouth 2 (two) times daily.   Yes [provider]  valACYclovir (VALTREX) 500 MG tablet Take 500 mg by mouth daily as needed (for cold sores).   Yes [provider]  ALPRAZolam Prudy Feeler) 0.5 MG tablet Take 0.5 mg by mouth 3 (three) times daily as needed. 06/24/21   [provider]  busPIRone (BUSPAR) 15 MG tablet Take 15 mg by mouth 2 (two) times daily.    [provider]  clonazePAM (KLONOPIN) 0.5 MG tablet SMARTSIG:0.5 Tablet(s) By Mouth 1-2 Times Daily PRN 11/24/21   [provider]  cloNIDine HCl (KAPVAY) 0.1 MG TB12 ER tablet Take by mouth at bedtime.    [provider]  lamoTRIgine (LAMICTAL) 25 MG tablet Take by mouth. 11/27/21   [provider]   No results found.  Positive ROS: All other systems have been reviewed and were otherwise negative with the exception of those mentioned in the HPI and as above.  Physical Exam: General: Alert, no acute distress Cardiovascular: No pedal edema Respiratory: No cyanosis, no use of accessory musculature GI: No organomegaly, abdomen is soft and non-tender Skin: No lesions in the area of chief complaint Neurologic: Sensation intact distally Psychiatric: Patient is competent for consent with normal mood and affect Lymphatic: No axillary or cervical lymphadenopathy  MUSCULOSKELETAL: Right upper extremity warm and well-perfused.  No open wounds or lesions.  Assessment: 1.  Right shoulder adhesive capsulitis  2.  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  Right shoulder subacromial impingement  Plan: Plan  to proceed today with arthroscopic surgery of the right shoulder to address the above.  We again reviewed the risk of bleeding, infection, damage to surrounding nerves and vessels, stiffness, failure of repairs, need for further surgery, as well as the risk of anesthesia.  She has provided informed consent.  Plan to discharge home postoperative from PACU.    Yolonda Kida, MD Cell [phone]    03/22/2023 7:19 AM

## 2023-03-22 NOTE — Anesthesia Procedure Notes (Signed)
Anesthesia Regional Block: Interscalene brachial plexus block   Pre-Anesthetic Checklist: , timeout performed,  Correct Patient, Correct Site, Correct Laterality,  Correct Procedure, Correct Position, site marked,  Risks and benefits discussed,  Pre-op evaluation,  At surgeon's request and post-op pain management  Laterality: Right  Prep: Maximum Sterile Barrier Precautions used, chloraprep       Needles:  Injection technique: Single-shot  Needle Type: Echogenic Stimulator Needle     Needle Length: 9cm  Needle Gauge: 22     Additional Needles:   Procedures:,,,, ultrasound used (permanent image in chart),,    Narrative:  Start time: 03/22/2023 8:47 AM End time: 03/22/2023 8:50 AM Injection made incrementally with aspirations every 5 mL.  Performed by: Personally  Anesthesiologist: Kaylyn Layer, MD  Additional Notes: Risks, benefits, and alternative discussed. Patient gave consent for procedure. Patient prepped and draped in sterile fashion. Sedation administered, patient remains easily responsive to voice. Relevant anatomy identified with ultrasound guidance. Local anesthetic given in 5cc increments with no signs or symptoms of intravascular injection. No pain or paraesthesias with injection. Patient monitored throughout procedure with signs of LAST or immediate complications. Tolerated well. Ultrasound image placed in chart.  Shari Greenhouse, MD

## 2023-03-22 NOTE — Progress Notes (Signed)
Assisted Dr. Howze with right, supraclavicular, ultrasound guided block. Side rails up, monitors on throughout procedure. See vital signs in flow sheet. Tolerated Procedure well. 

## 2023-03-22 NOTE — Discharge Instructions (Addendum)
Orthopedic surgery discharge instructions:  -Maintain postoperative bandage until postoperative day #3.  Do not submerge underwater.  Leave the Steri-Strips in place.  You may cover your incisions with dry dressings or Band-Aids after the initial removal.  -No lifting over 2 pounds with operateive arm.  You may use the arm immediately for activities of daily living such as bathing, washing your face and brushing your teeth, eating, and getting dressed.  Otherwise dc your sling as able, and wear for comfort as needed.  -Apply ice liberally to the shoulder throughout the day.  For mild to moderate pain use Tylenol and Advil as needed around-the-clock.  For breakthrough pain use oxycodone as necessary.  -You will return to see Dr. Aundria Rud in the office in 2 weeks for routine postoperative check.    Post Anesthesia Home Care Instructions  Activity: Get plenty of rest for the remainder of the day. A responsible individual must stay with you for 24 hours following the procedure.  For the next 24 hours, DO NOT: -Drive a car -Advertising copywriter -Drink alcoholic beverages -Take any medication unless instructed by your physician -Make any legal decisions or sign important papers.  Meals: Start with liquid foods such as gelatin or soup. Progress to regular foods as tolerated. Avoid greasy, spicy, heavy foods. If nausea and/or vomiting occur, drink only clear liquids until the nausea and/or vomiting subsides. Call your physician if vomiting continues.  Special Instructions/Symptoms: Your throat may feel dry or sore from the anesthesia or the breathing tube placed in your throat during surgery. If this causes discomfort, gargle with warm salt water. The discomfort should disappear within 24 hours.  If you had a scopolamine patch placed behind your ear for the management of post- operative nausea and/or vomiting:  1. The medication in the patch is effective for 72 hours, after which it should be  removed.  Wrap patch in a tissue and discard in the trash. Wash hands thoroughly with soap and water. 2. You may remove the patch earlier than 72 hours if you experience unpleasant side effects which may include dry mouth, dizziness or visual disturbances. 3. Avoid touching the patch. Wash your hands with soap and water after contact with the patch.     Regional Anesthesia Blocks  1. Numbness or the inability to move the "blocked" extremity may last from 3-48 hours after placement. The length of time depends on the medication injected and your individual response to the medication. If the numbness is not going away after 48 hours, call your surgeon.  2. The extremity that is blocked will need to be protected until the numbness is gone and the  Strength has returned. Because you cannot feel it, you will need to take extra care to avoid injury. Because it may be weak, you may have difficulty moving it or using it. You may not know what position it is in without looking at it while the block is in effect.  3. For blocks in the legs and feet, returning to weight bearing and walking needs to be done carefully. You will need to wait until the numbness is entirely gone and the strength has returned. You should be able to move your leg and foot normally before you try and bear weight or walk. You will need someone to be with you when you first try to ensure you do not fall and possibly risk injury.  4. Bruising and tenderness at the needle site are common side effects and will resolve in  a few days.  5. Persistent numbness or new problems with movement should be communicated to the surgeon or the [facility] [phone] [facility] [phone]).  May have Tylenol today after 2:00 PM

## 2023-03-22 NOTE — Transfer of Care (Signed)
Immediate Anesthesia Transfer of Care Note  Patient: Shari Stewart  Procedure(s) Performed: SHOULDER ARTHROSCOPY WITH DISTAL CLAVICLE RESECTION (Right: Shoulder) SHOULDER ARTHROSCOPY WITH SUBACROMIAL DECOMPRESSION (Right: Shoulder)  Patient Location: PACU  Anesthesia Type:GA combined with regional for post-op pain  Level of Consciousness: awake, alert , and oriented  Airway & Oxygen Therapy: Patient Spontanous Breathing  Post-op Assessment: Report given to RN and Post -op Vital signs reviewed and stable  Post vital signs: Reviewed and stable  Last Vitals:  Vitals Value Taken Time  BP 148/97 03/22/23 1015  Temp    Pulse 93 03/22/23 1016  Resp 17 03/22/23 1016  SpO2 100 % 03/22/23 1016  Vitals shown include unvalidated device data.  Last Pain:  Vitals:   03/22/23 0747  TempSrc: Oral  PainSc: 7       Patients Stated Pain Goal: 2 (03/22/23 0747)  Complications: No notable events documented.

## 2023-03-22 NOTE — Anesthesia Procedure Notes (Signed)
Procedure Name: Intubation Date/Time: 03/22/2023 9:20 AM  Performed by: Demetrio Lapping, CRNAPre-anesthesia Checklist: Patient identified, Emergency Drugs available, Suction available and Patient being monitored Patient Re-evaluated:Patient Re-evaluated prior to induction Oxygen Delivery Method: Circle System Utilized Preoxygenation: Pre-oxygenation with 100% oxygen Induction Type: IV induction Ventilation: Mask ventilation without difficulty Laryngoscope Size: Mac and 3 Grade View: Grade I Tube type: Oral Number of attempts: 1 Airway Equipment and Method: Stylet Placement Confirmation: ETT inserted through vocal cords under direct vision, positive ETCO2 and breath sounds checked- equal and bilateral Secured at: 21 cm Tube secured with: Tape Dental Injury: Teeth and Oropharynx as per pre-operative assessment

## 2023-03-22 NOTE — Anesthesia Postprocedure Evaluation (Signed)
Anesthesia Post Note  Patient: Shari Stewart  Procedure(s) Performed: SHOULDER ARTHROSCOPY WITH DISTAL CLAVICLE RESECTION (Right: Shoulder) SHOULDER ARTHROSCOPY WITH SUBACROMIAL DECOMPRESSION (Right: Shoulder)     Patient location during evaluation: PACU Anesthesia Type: General Level of consciousness: awake and alert Pain management: pain level controlled Vital Signs Assessment: post-procedure vital signs reviewed and stable Respiratory status: spontaneous breathing, nonlabored ventilation and respiratory function stable Cardiovascular status: blood pressure returned to baseline Postop Assessment: no apparent nausea or vomiting Anesthetic complications: no   No notable events documented.  Last Vitals:  Vitals:   03/22/23 1050 03/22/23 1100  BP: (!) 147/101 (!) 150/102  Pulse: 87 89  Resp: 14 16  Temp:  (!) 36.2 C  SpO2: 98% 96%    Last Pain:  Vitals:   03/22/23 1100  TempSrc:   PainSc: 2                  Shanda Howells

## 2023-03-22 NOTE — Anesthesia Preprocedure Evaluation (Addendum)
Anesthesia Evaluation  Patient identified by MRN, date of birth, ID band Patient awake    Reviewed: Allergy & Precautions, NPO status , Patient's Chart, lab work & pertinent test results  History of Anesthesia Complications Negative for: history of anesthetic complications  Airway Mallampati: II  TM Distance: >3 FB Neck ROM: Full    Dental no notable dental hx.    Pulmonary Current Smoker   Pulmonary exam normal        Cardiovascular negative cardio ROS Normal cardiovascular exam     Neuro/Psych   Anxiety     negative neurological ROS     GI/Hepatic negative GI ROS, Neg liver ROS,,,  Endo/Other  negative endocrine ROS    Renal/GU negative Renal ROS     Musculoskeletal Right shoulder impingment, acromioclavicular osteoarthritis   Abdominal   Peds  Hematology negative hematology ROS (+)   Anesthesia Other Findings Day of surgery medications reviewed with patient.  Reproductive/Obstetrics                              Anesthesia Physical Anesthesia Plan  ASA: 2  Anesthesia Plan: General   Post-op Pain Management: Regional block* and Tylenol PO (pre-op)*   Induction: Intravenous  PONV Risk Score and Plan: 2 and Treatment may vary due to age or medical condition, Midazolam, Dexamethasone and Ondansetron  Airway Management Planned: Oral ETT  Additional Equipment: None  Intra-op Plan:   Post-operative Plan: Extubation in OR  Informed Consent: I have reviewed the patients History and Physical, chart, labs and discussed the procedure including the risks, benefits and alternatives for the proposed anesthesia with the patient or authorized representative who has indicated his/her understanding and acceptance.     Dental advisory given  Plan Discussed with: CRNA  Anesthesia Plan Comments:          Anesthesia Quick Evaluation

## 2023-03-22 NOTE — Op Note (Signed)
Date of Surgery: 03/22/2023  INDICATIONS: Ms. Gladney is a 47 y.o.-year-old female with a right shoulder symptomatic AC joint arthropathy as well as symptomatic impingement.  Here today for arthroscopic surgery.;  The patient did consent to the procedure after discussion of the risks and benefits.  PREOPERATIVE DIAGNOSIS:  1.  Right shoulder subacromial impingement 2.  Right shoulder acromioclavicular joint arthropathy  POSTOPERATIVE DIAGNOSIS:  1.  Right shoulder subacromial impingement 2.  Right shoulder acromioclavicular joint arthropathy 3.  Right shoulder partial articular sided supraspinatus tearing 4.  Right shoulder degenerative anterior labral tearing  PROCEDURE:  1.  Right shoulder arthroscopic extensive debridement of anterior labrum, supraspinatus tendon, rotator interval 2.  Right shoulder arthroscopic subacromial decompression with release of coracoacromial ligament 3.  Right shoulder distal clavicle resection of 1 cm  SURGEON: Maryan Rued, M.D.  ASSIST: Dion Saucier, PA-C  Assistant attestation:  PA Sharon Seller was present for the entire procedure..  ANESTHESIA:  general, with interscalene regional anesthetic  IV FLUIDS AND URINE: See anesthesia.  ESTIMATED BLOOD LOSS: 20 mL.  IMPLANTS: None  DRAINS: None  COMPLICATIONS: None.  DESCRIPTION OF PROCEDURE: The patient was brought to the operating room and placed supine on the operating table.  The patient had been signed prior to the procedure and this was documented. The patient had the anesthesia placed by the anesthesiologist.  A time-out was performed to confirm that this was the correct patient, site, side and location. The patient did receive antibiotics prior to the incision and was re-dosed during the procedure as needed at indicated intervals.  A tourniquet was not placed.  The patient had the operative extremity prepped and draped in the standard surgical fashion.      After obtaining informed consent the  patient was brought to the operating table and underwent satisfactory anesthesia. An exam under anesthesia revealed full range of motion. He was placed in the left lateral decubitus position with an axillary roll and all bony prominences properly padded. A standard surgical timeout was performed. He was placed in 10 pounds of gentle in-line suspension.  Standard posterior and anterior superior portals were established.  Diagnostic arthroscopy was then undertaken in the intra-articular portion of the joint.  This did note no chondromalacia of the glenohumeral joint.  No loose bodies.  Intact rotator cuff musculature of the subscapularis as well as infraspinatus and teres minor.  There was partial articular tearing of the supraspinatus leading edge that was less than 10%.  The biceps tendon was intact without tearing or tendon nidus.  There was no SLAP pathology.  There was however, degenerative anterior labral tearing.  Following diagnostic arthroscopy we proceeded with extensive debridement of the right shoulder utilizing motorized shaver and radiofrequency wand.  We debrided the rotator interval and release the middle glenohumeral ligament to enhance postoperative motion.  We also gently debrided unstable anterior labral tissue.  Likewise, we debrided the articular side of the supraspinatus partial tear.  Next, we moved into the subacromial space.  We established a lateral working portal approximately 4 cm distant to the lateral edge of the acromion at the midportion of the anterior acromioclavicular joint.  While viewing posteriorly and working lateral we identified that there were no bursal sided rotator cuff tears encountered.  We did resect/removed the entire subacromial bursa.  We then utilized a cutting block technique to perform a subacromial decompression.  While viewing from the lateral portal and working from the posterior portal we used the radiofrequency wand to subperiosteally dissect the  anterior lateral corner of the acromion.  We released the CA ligament.  We then utilized a motorized bur to create a nice flat type I acromion after identifying type II morphology preresection.  Lastly, we moved ahead with our distal clavicle resection.  Again while viewing lateral we then established a access to the [facility] joint through the mid glenoid portal.  Radiofrequency wand was used to dissect the distal clavicle.  We then introduced our bur and resected 1 cm of distal clavicle.  Care was taken to preserve the superior and posterior capsular structures to prevent anterior posterior instability.  The arthroscope was then removed and portals closed with 4-0 Monocryl in standard fashion followed by a sterile occlusive dressing Polar Care ice sleeve and a slingshot sling. The patient was sent to recovery in stable condition and tolerated the procedure well  POSTOPERATIVE PLAN:  Shari Stewart can use the right upper extremity immediately for all activities as tolerated.  She will be in the sling just for comfort.  She will ice aggressively.  Discharge home today from PACU.  I will see her back in the office in 2 weeks.  She can go ahead with progressive range of motion and strengthening.

## 2023-03-22 NOTE — Brief Op Note (Signed)
03/22/2023  10:02 AM  PATIENT:  Shari Stewart  47 y.o. female  PRE-OPERATIVE DIAGNOSIS:  Right shoulder impingment, acromioclavicular osteoarthritis  POST-OPERATIVE DIAGNOSIS:  Right shoulder impingment, acromioclavicular osteoarthritis, right shoulder partial articular sided supraspinatus tear, right shoulder degenerative anterior labral tear  PROCEDURE:  Procedure(s): SHOULDER ARTHROSCOPY WITH DISTAL CLAVICLE RESECTION (Right) SHOULDER ARTHROSCOPY WITH SUBACROMIAL DECOMPRESSION (Right) Right shoulder arthroscopic extensive debridement of supraspinatus tendon, rotator interval, anterior labrum.  SURGEON:  Surgeon(s) and Role:    * Shalaya Swailes, Noah Delaine, MD - Primary  PHYSICIAN ASSISTANT: Dion Saucier, PA-C  ANESTHESIA:   regional and general  EBL:  30 mL   BLOOD ADMINISTERED:none  DRAINS: none   LOCAL MEDICATIONS USED:  NONE  SPECIMEN:  No Specimen  DISPOSITION OF SPECIMEN:  N/A  COUNTS:  YES  TOURNIQUET:  * No tourniquets in log *  DICTATION: .Note written in EPIC  PLAN OF CARE: Discharge to home after PACU  PATIENT DISPOSITION:  PACU - hemodynamically stable.   Delay start of Pharmacological VTE agent (>24hrs) due to surgical blood loss or risk of bleeding: not applicable

## 2023-03-25 ENCOUNTER — Encounter (HOSPITAL_BASED_OUTPATIENT_CLINIC_OR_DEPARTMENT_OTHER): Payer: Self-pay | Admitting: Orthopedic Surgery

## 2023-04-28 ENCOUNTER — Telehealth: Payer: Medicaid Other | Admitting: Family

## 2023-04-28 DIAGNOSIS — T148XXA Other injury of unspecified body region, initial encounter: Secondary | ICD-10-CM

## 2023-04-28 NOTE — Progress Notes (Signed)
Virtual Visit Consent   Shari Stewart, you are scheduled for a virtual visit with a [facility] provider today. Just as with appointments in the office, your consent must be obtained to participate. Your consent will be active for this visit and any virtual visit you may have with one of our providers in the next 365 days. If you have a MyChart account, a copy of this consent can be sent to you electronically.  As this is a virtual visit, video technology does not allow for your provider to perform a traditional examination. This may limit your provider's ability to fully assess your condition. If your provider identifies any concerns that need to be evaluated in person or the need to arrange testing (such as labs, EKG, etc.), we will make arrangements to do so. Although advances in technology are sophisticated, we cannot ensure that it will always work on either your end or our end. If the connection with a video visit is poor, the visit may have to be switched to a telephone visit. With either a video or telephone visit, we are not always able to ensure that we have a secure connection.  By engaging in this virtual visit, you consent to the provision of healthcare and authorize for your insurance to be billed (if applicable) for the services provided during this visit. Depending on your insurance coverage, you may receive a charge related to this service.  I need to obtain your verbal consent now. Are you willing to proceed with your visit today? Shari Stewart has provided verbal consent on 04/28/2023 for a virtual visit (video or telephone). Jannifer Rodney, FNP  Date: 04/28/2023 6:40 PM  Virtual Visit via Video Note   I, Jannifer Rodney, connected with  Shari Stewart  ([id number], 1976/03/26) on 04/28/23 at  7:00 PM EDT by a video-enabled telemedicine application and verified that I am speaking with the correct person using two identifiers.  Location: Patient: Virtual Visit Location Patient:  vacation  Provider: Virtual Visit Location Provider: Home Office   I discussed the limitations of evaluation and management by telemedicine and the availability of in person appointments. The patient expressed understanding and agreed to proceed.    History of Present Illness: Shari Stewart is a 47 y.o. who identifies as a female who was assigned female at birth, and is being seen today for puncture wound. Reports about 10 mins ago she walking and stepped on a nail. She is unsure when her last TDAP was. She has cleaned the area and put a bandaid on, however, it bled through.   HPI: HPI  Problems:  Patient Active Problem List   Diagnosis Date Noted   Angio-edema 11/30/2021   Rash and other nonspecific skin eruption 11/30/2021   Elevated blood pressure reading 11/30/2021   Allergic reaction 09/20/2021    Allergies:  Allergies  Allergen Reactions   Bee Pollen Swelling   Betadine [Povidone Iodine]     BLISTERS   Erythromycin Hives and Nausea And Vomiting   Gris-Peg [Griseofulvin] Swelling   Other     Polymethyl methacrylate   Medications:  Current Outpatient Medications:    ALPRAZolam (XANAX) 0.5 MG tablet, Take 0.5 mg by mouth 3 (three) times daily as needed., Disp: , Rfl:    amphetamine-dextroamphetamine (ADDERALL XR) 30 MG 24 hr capsule, Take 30 mg by mouth daily., Disp: , Rfl:    amphetamine-dextroamphetamine (ADDERALL) 15 MG tablet, Take 15 mg by mouth 2 (two) times daily., Disp: , Rfl:  busPIRone (BUSPAR) 15 MG tablet, Take 15 mg by mouth 2 (two) times daily., Disp: , Rfl:    clonazePAM (KLONOPIN) 0.5 MG tablet, SMARTSIG:0.5 Tablet(s) By Mouth 1-2 Times Daily PRN, Disp: , Rfl:    cloNIDine HCl (KAPVAY) 0.1 MG TB12 ER tablet, Take by mouth at bedtime., Disp: , Rfl:    HYDROcodone-acetaminophen (NORCO/VICODIN) 5-325 MG tablet, Take 1 tablet by mouth every 4 (four) hours as needed for moderate pain or severe pain., Disp: 22 tablet, Rfl: 0   lamoTRIgine (LAMICTAL) 25 MG  tablet, Take by mouth., Disp: , Rfl:    ondansetron (ZOFRAN) 4 MG tablet, Take 1 tablet (4 mg total) by mouth every 8 (eight) hours as needed for vomiting or nausea., Disp: 20 tablet, Rfl: 0   valACYclovir (VALTREX) 500 MG tablet, Take 500 mg by mouth daily as needed (for cold sores)., Disp: , Rfl:   Observations/Objective: Patient is well-developed, well-nourished in no acute distress.  Resting comfortably  at home.  Head is normocephalic, atraumatic.  No labored breathing.  Speech is clear and coherent with logical content.  Patient is alert and oriented at baseline.  Deep puncture wound that is actively oozing blood  Assessment and Plan: 1. Puncture wound  Recommend patient to go to Urgent care as she needs to update her TDAP Also may need suture if bleeding continues  Apply pressure Keep clean and dry  Follow Up Instructions: I discussed the assessment and treatment plan with the patient. The patient was provided an opportunity to ask questions and all were answered. The patient agreed with the plan and demonstrated an understanding of the instructions.  A copy of instructions were sent to the patient via MyChart unless otherwise noted below.     The patient was advised to call back or seek an in-person evaluation if the symptoms worsen or if the condition fails to improve as anticipated.  Time:  I spent 6 minutes with the patient via telehealth technology discussing the above problems/concerns.    Jannifer Rodney, FNP

## 2023-07-25 ENCOUNTER — Emergency Department (HOSPITAL_COMMUNITY)
Admission: EM | Admit: 2023-07-25 | Discharge: 2023-07-25 | Disposition: A | Discharge status: Home / Self Care | Payer: Medicaid Other | Attending: Emergency Medicine | Admitting: Emergency Medicine

## 2023-07-25 ENCOUNTER — Other Ambulatory Visit: Payer: Self-pay

## 2023-07-25 DIAGNOSIS — T782XXA Anaphylactic shock, unspecified, initial encounter: Secondary | ICD-10-CM | POA: Diagnosis present

## 2023-07-25 MED ORDER — METHYLPREDNISOLONE SODIUM SUCC 125 MG IJ SOLR
125.0000 mg | Freq: Once | INTRAMUSCULAR | Status: AC
  Administered 2023-07-25: 125 mg via INTRAVENOUS
  Filled 2023-07-25: qty 2

## 2023-07-25 MED ORDER — FAMOTIDINE 20 MG PO TABS: 20.0000 mg | ORAL_TABLET | Freq: Every day | ORAL | 0 refills | Status: DC

## 2023-07-25 MED ORDER — METHYLPREDNISOLONE 4 MG PO TBPK: ORAL_TABLET | ORAL | 0 refills | Status: DC

## 2023-07-25 MED ORDER — FAMOTIDINE IN NACL 20-0.9 MG/50ML-% IV SOLN
20.0000 mg | Freq: Once | INTRAVENOUS | Status: AC
  Administered 2023-07-25: 20 mg via INTRAVENOUS
  Filled 2023-07-25: qty 50

## 2023-07-25 NOTE — Discharge Instructions (Signed)
We have monitored you for a few hours following your self administration of your EpiPen and given that your symptoms have completely resolved this is reasonable for you to be discharged home.  I have given you a prescription for a steroid Dosepak for you to fill and take as prescribed in its entirety for continued treatment of your reaction.  I have also given you Pepcid to take which is an antihistamine.  Please refill your EpiPen as well as you have it on hand just in case.  Follow-up with your primary doctor in the next few days.  Return if development of any new or worsening symptoms.

## 2023-07-25 NOTE — ED Triage Notes (Addendum)
Pt arrives via [facility] follow an allergic reaction. EMS report eye swelling, throat swelling, and SOB. Epi Pen x2, Epi @ 10 pm followed by second one at 11 pm. Sts eating sushi for dinner. Diphenhydramine 50 IV by EMS. Red and flushed on arrival.

## 2023-07-25 NOTE — ED Provider Notes (Signed)
[facility] AT [facility] Provider Note   CSN: [id number] Arrival date & time: 07/25/23  0124     History  Chief Complaint  Patient presents with   Allergic Reaction    Shari Stewart is a 47 y.o. female.  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 She states that same occurred earlier this evening around 10 PM when she started having itching to her face and swelling around her eyes. She is unsure of the trigger but states that she suspects it was the sushi she had for dinner around 6 PM. She states she has had anaphylaxis several times previously and has an Epi pen at home and she used it immediately when her symptoms began. Her significant other present at bedside then went to the pharmacy and picked up another EpiPen and around 11 PM when she was still having tongue and throat swelling she gave herself a second injection.  She then called EMS for transport as her symptoms were not completely resolving.  EMS gave her 50mg  of benadryl and transported here for evaluation.  Upon presentation, patient states that she is normally having pulmonary throat swelling.  She does note that she is still feeling 'flush' and having some itching. Denies chest pain or shortness of breath. No nausea or vomiting.  The history is provided by the patient. No language interpreter was used.  Allergic Reaction      Home Medications Prior to Admission medications   Medication Sig Start Date End Date Taking? Authorizing Provider  ALPRAZolam Prudy Feeler) 0.5 MG tablet Take 0.5 mg by mouth 3 (three) times daily as needed. 06/24/21   [provider]  amphetamine-dextroamphetamine (ADDERALL XR) 30 MG 24 hr capsule Take 30 mg by mouth daily.    [provider]  amphetamine-dextroamphetamine (ADDERALL) 15 MG tablet Take 15 mg by mouth 2 (two) times daily.    [provider]  busPIRone (BUSPAR) 15 MG  tablet Take 15 mg by mouth 2 (two) times daily.    [provider]  clonazePAM (KLONOPIN) 0.5 MG tablet SMARTSIG:0.5 Tablet(s) By Mouth 1-2 Times Daily PRN 11/24/21   [provider]  cloNIDine HCl (KAPVAY) 0.1 MG TB12 ER tablet Take by mouth at bedtime.    [provider]  HYDROcodone-acetaminophen (NORCO/VICODIN) 5-325 MG tablet Take 1 tablet by mouth every 4 (four) hours as needed for moderate pain or severe pain. 03/22/23 03/21/24  Yolonda Kida, MD  lamoTRIgine (LAMICTAL) 25 MG tablet Take by mouth. 11/27/21   [provider]  ondansetron (ZOFRAN) 4 MG tablet Take 1 tablet (4 mg total) by mouth every 8 (eight) hours as needed for vomiting or nausea. 03/22/23   Yolonda Kida, MD  valACYclovir (VALTREX) 500 MG tablet Take 500 mg by mouth daily as needed (for cold sores).    [provider]      Allergies    Bee pollen, Betadine [povidone iodine], Erythromycin, Gris-peg [griseofulvin], and Other    Review of Systems   Review of Systems  All other systems reviewed and are negative.   Physical Exam Updated Vital Signs BP (!) 160/99   Pulse 83   Temp 98.3 F (36.8 C) (Oral)   Resp 20   SpO2 95%  Physical Exam Vitals and nursing note reviewed.  Constitutional:      General: She is not in acute distress.    Appearance: Normal appearance. She is normal weight. She is not  ill-appearing, toxic-appearing or diaphoretic.     Comments: Patient speaking in complete sentences in no acute distress  HENT:     Head: Normocephalic and atraumatic.     Mouth/Throat:     Comments: No angioedema or lip swelling.  Eyes:     Extraocular Movements: Extraocular movements intact.     Pupils: Pupils are equal, round, and reactive to light.  Cardiovascular:     Rate and Rhythm: Normal rate and regular rhythm.     Heart sounds: Normal heart sounds.  Pulmonary:     Effort: Pulmonary effort is normal. No respiratory distress.     Breath sounds:  Normal breath sounds. No stridor.  Abdominal:     General: Abdomen is flat.     Palpations: Abdomen is soft.     Tenderness: There is no abdominal tenderness.  Musculoskeletal:        General: Normal range of motion.     Cervical back: Normal range of motion.  Skin:    General: Skin is warm and dry.     Comments: Face is flushed and red with mild swelling to the bilateral eyelids.   Neurological:     General: No focal deficit present.     Mental Status: She is alert.  Psychiatric:        Mood and Affect: Mood normal.        Behavior: Behavior normal.     ED Results / Procedures / Treatments   Labs (all labs ordered are listed, but only abnormal results are displayed) Labs Reviewed - No data to display  EKG None  Radiology No results found.  Procedures Procedures    Medications Ordered in ED Medications  methylPREDNISolone sodium succinate (SOLU-MEDROL) 125 mg/2 mL injection 125 mg (125 mg Intravenous Given 07/25/23 0233)  famotidine (PEPCID) IVPB 20 mg premix (0 mg Intravenous Stopped 07/25/23 7829)    ED Course/ Medical Decision Making/ A&P                                 Medical Decision Making Risk Prescription drug management.   Patient presents today with complaints of allergic reaction.  Upon my evaluation after patient has already given presents 2 EpiPen injections, patient has afebrile, nontoxic-appearing, and in no distress with reassuring vital signs.  Physical exam reveals well-appearing patient speaking in complete sentences with some flushing to the face and mild swelling to the bilateral eyelids. No tongue or lip swelling, no stridor. Patient gave her first Epi at 10 PM and the second at 36 PM. EMS gave 50 of benadryl. Will order Solu-Medrol and Pepcid and continue to monitor.  5:00 AM: After several hours of monitoring, on reevaluation patient is resting comfortably in states she feels like she is back to normal.  She she feels ready to go home.  Will  send Medrol Dosepak and prescription for Pepcid as well.  Patient notes that she has plenty of refills of her EpiPen. Evaluation and diagnostic testing in the emergency department does not suggest an emergent condition requiring admission or immediate intervention beyond what has been performed at this time.  Plan for discharge with close PCP follow-up.  Patient is understanding and amenable with plan, educated on red flag symptoms that would prompt immediate return.  Patient discharged in stable condition.  Final Clinical Impression(s) / ED Diagnoses Final diagnoses:  Anaphylaxis, initial encounter    Rx / DC Orders ED Discharge Orders  Ordered    methylPREDNISolone (MEDROL DOSEPAK) 4 MG TBPK tablet        07/25/23 0518    famotidine (PEPCID) 20 MG tablet  Daily        07/25/23 0518          An After Visit Summary was printed and given to the patient.     Vear Clock 07/25/23 4098    Palumbo, April, MD 07/25/23 [phone]

## 2023-07-25 NOTE — ED Notes (Signed)
Discharge instructions provided by edp were discussed with pt. Pt verbalized understanding with no additional questions at this time. Pt to go home with s/o at bedside

## 2023-07-28 ENCOUNTER — Encounter (HOSPITAL_BASED_OUTPATIENT_CLINIC_OR_DEPARTMENT_OTHER): Payer: Self-pay

## 2023-07-28 ENCOUNTER — Emergency Department (HOSPITAL_BASED_OUTPATIENT_CLINIC_OR_DEPARTMENT_OTHER): Payer: Medicaid Other

## 2023-07-28 ENCOUNTER — Other Ambulatory Visit: Payer: Self-pay

## 2023-07-28 ENCOUNTER — Emergency Department (HOSPITAL_BASED_OUTPATIENT_CLINIC_OR_DEPARTMENT_OTHER)
Admission: EM | Admit: 2023-07-28 | Discharge: 2023-07-28 | Disposition: A | Discharge status: Home / Self Care | Payer: Medicaid Other | Attending: Emergency Medicine | Admitting: Emergency Medicine

## 2023-07-28 DIAGNOSIS — I10 Essential (primary) hypertension: Secondary | ICD-10-CM | POA: Insufficient documentation

## 2023-07-28 DIAGNOSIS — R000 Tachycardia, unspecified: Secondary | ICD-10-CM | POA: Diagnosis present

## 2023-07-28 DIAGNOSIS — R002 Palpitations: Secondary | ICD-10-CM | POA: Insufficient documentation

## 2023-07-28 DIAGNOSIS — Z79899 Other long term (current) drug therapy: Secondary | ICD-10-CM | POA: Insufficient documentation

## 2023-07-28 LAB — CBC WITH DIFFERENTIAL/PLATELET
Abs Immature Granulocytes: 0.09 10*3/uL — ABNORMAL HIGH (ref 0.00–0.07)
Basophils Absolute: 0 10*3/uL (ref 0.0–0.1)
Basophils Relative: 0 %
Eosinophils Absolute: 0 10*3/uL (ref 0.0–0.5)
Eosinophils Relative: 0 %
HCT: 47.2 % — ABNORMAL HIGH (ref 36.0–46.0)
Hemoglobin: 16.7 g/dL — ABNORMAL HIGH (ref 12.0–15.0)
Immature Granulocytes: 1 %
Lymphocytes Relative: 18 %
Lymphs Abs: 2.5 10*3/uL (ref 0.7–4.0)
MCH: 32.4 pg (ref 26.0–34.0)
MCHC: 35.4 g/dL (ref 30.0–36.0)
MCV: 91.7 fL (ref 80.0–100.0)
Monocytes Absolute: 0.7 10*3/uL (ref 0.1–1.0)
Monocytes Relative: 5 %
Neutro Abs: 10.4 10*3/uL — ABNORMAL HIGH (ref 1.7–7.7)
Neutrophils Relative %: 76 %
Platelets: 303 10*3/uL (ref 150–400)
RBC: 5.15 MIL/uL — ABNORMAL HIGH (ref 3.87–5.11)
RDW: 13.5 % (ref 11.5–15.5)
WBC: 13.7 10*3/uL — ABNORMAL HIGH (ref 4.0–10.5)
nRBC: 0 % (ref 0.0–0.2)

## 2023-07-28 LAB — BASIC METABOLIC PANEL
Anion gap: 12 (ref 5–15)
BUN: 12 mg/dL (ref 6–20)
CO2: 25 mmol/L (ref 22–32)
Calcium: 9.8 mg/dL (ref 8.9–10.3)
Chloride: 100 mmol/L (ref 98–111)
Creatinine, Ser: 0.96 mg/dL (ref 0.44–1.00)
GFR, Estimated: >60 mL/min (ref >60)
Glucose, Bld: 95 mg/dL (ref 70–99)
Potassium: 3.2 mmol/L — ABNORMAL LOW (ref 3.5–5.1)
Sodium: 137 mmol/L (ref 135–145)

## 2023-07-28 LAB — TROPONIN I (HIGH SENSITIVITY): Troponin I (High Sensitivity): 3 ng/L (ref <18)

## 2023-07-28 LAB — D-DIMER, QUANTITATIVE: D-Dimer, Quant: <0.27 ug/mL-FEU (ref 0.00–0.50)

## 2023-07-28 NOTE — ED Triage Notes (Addendum)
Pt presents with elevated B/P and HR today. States she was recently seen and treated for an allergic reaction. Pt was placed on benadryl and prednisone. Pt has been feeling more anxious since starting the medicaitons.

## 2023-07-28 NOTE — ED Provider Notes (Signed)
[place] EMERGENCY DEPARTMENT AT [facility] Provider Note   CSN: [id number] Arrival date & time: 07/28/23  2121     History  Chief Complaint  Patient presents with   Hypertension    Shari Stewart is a 47 y.o. female.  Patient here with some shins, hypertension, tachycardia.  Has been on steroids and prednisone for allergic reaction the last few days.  Overall rash from allergic reactions pretty much improved.  Still has a little bit of redness and flushing.  She is got multiple allergies.  She has been a little short of breath.  Denies any chest pain.  No stroke symptoms.  No weakness numbness tingling.  No headaches.  She is not on any blood pressure meds.  Denies any abdominal pain nausea vomiting diarrhea.  The history is provided by the patient.       Home Medications Prior to Admission medications   Medication Sig Start Date End Date Taking? Authorizing Provider  ALPRAZolam Prudy Feeler) 0.5 MG tablet Take 0.5 mg by mouth 3 (three) times daily as needed. 06/24/21   [provider]  amphetamine-dextroamphetamine (ADDERALL XR) 30 MG 24 hr capsule Take 30 mg by mouth daily.    [provider]  amphetamine-dextroamphetamine (ADDERALL) 15 MG tablet Take 15 mg by mouth 2 (two) times daily.    [provider]  busPIRone (BUSPAR) 15 MG tablet Take 15 mg by mouth 2 (two) times daily.    [provider]  clonazePAM (KLONOPIN) 0.5 MG tablet SMARTSIG:0.5 Tablet(s) By Mouth 1-2 Times Daily PRN 11/24/21   [provider]  cloNIDine HCl (KAPVAY) 0.1 MG TB12 ER tablet Take by mouth at bedtime.    [provider]  famotidine (PEPCID) 20 MG tablet Take 1 tablet (20 mg total) by mouth daily. 07/25/23   Smoot, Shawn Route, PA-C  HYDROcodone-acetaminophen (NORCO/VICODIN) 5-325 MG tablet Take 1 tablet by mouth every 4 (four) hours as needed for moderate pain or severe pain. 03/22/23 03/21/24  Yolonda Kida, MD  lamoTRIgine (LAMICTAL) 25  MG tablet Take by mouth. 11/27/21   [provider]  methylPREDNISolone (MEDROL DOSEPAK) 4 MG TBPK tablet Take as directed on package 07/25/23   Smoot, Sarah A, PA-C  ondansetron (ZOFRAN) 4 MG tablet Take 1 tablet (4 mg total) by mouth every 8 (eight) hours as needed for vomiting or nausea. 03/22/23   Yolonda Kida, MD  valACYclovir (VALTREX) 500 MG tablet Take 500 mg by mouth daily as needed (for cold sores).    [provider]      Allergies    Bee pollen, Betadine [povidone iodine], Erythromycin, Gris-peg [griseofulvin], and Other    Review of Systems   Review of Systems  Physical Exam Updated Vital Signs BP (!) 173/135   Pulse (!) 121   Temp 98 F (36.7 C)   Resp 20   Ht 5\' 6"  (1.676 m)   Wt 76.2 kg   LMP 07/26/2023   SpO2 100%   BMI 27.12 kg/m  Physical Exam Vitals and nursing note reviewed.  Constitutional:      General: She is not in acute distress.    Appearance: She is well-developed. She is not ill-appearing.  HENT:     Head: Normocephalic and atraumatic.     Nose: Nose normal.     Mouth/Throat:     Mouth: Mucous membranes are moist.  Eyes:     Extraocular Movements: Extraocular movements intact.     Conjunctiva/sclera: Conjunctivae normal.  Pupils: Pupils are equal, round, and reactive to light.  Cardiovascular:     Rate and Rhythm: Regular rhythm. Tachycardia present.     Pulses: Normal pulses.     Heart sounds: Normal heart sounds. No murmur heard. Pulmonary:     Effort: Pulmonary effort is normal. No respiratory distress.     Breath sounds: Normal breath sounds.  Abdominal:     Palpations: Abdomen is soft.     Tenderness: There is no abdominal tenderness.  Musculoskeletal:        General: No swelling.     Cervical back: Normal range of motion and neck supple.  Skin:    General: Skin is warm and dry.     Capillary Refill: Capillary refill takes less than 2 seconds.  Neurological:     General: No focal deficit present.      Mental Status: She is alert and oriented to person, place, and time.     Cranial Nerves: No cranial nerve deficit.     Sensory: No sensory deficit.     Motor: No weakness.     Coordination: Coordination normal.     Comments: 5+ out of 5 strength throughout, normal sensation, normal coordination, normal visual fields  Psychiatric:        Mood and Affect: Mood normal.     ED Results / Procedures / Treatments   Labs (all labs ordered are listed, but only abnormal results are displayed) Labs Reviewed  CBC WITH DIFFERENTIAL/PLATELET - Abnormal; Notable for the following components:      Result Value   WBC 13.7 (*)    RBC 5.15 (*)    Hemoglobin 16.7 (*)    HCT 47.2 (*)    Neutro Abs 10.4 (*)    Abs Immature Granulocytes 0.09 (*)    All other components within normal limits  BASIC METABOLIC PANEL - Abnormal; Notable for the following components:   Potassium 3.2 (*)    All other components within normal limits  D-DIMER, QUANTITATIVE  TROPONIN I (HIGH SENSITIVITY)    EKG EKG Interpretation Date/Time:  Sunday July 28 2023 21:29:33 EDT Ventricular Rate:  128 PR Interval:  146 QRS Duration:  72 QT Interval:  296 QTC Calculation: 432 R Axis:   37  Text Interpretation: Sinus tachycardia Right atrial enlargement Anterior infarct , age undetermined Abnormal ECG When compared with ECG of 25-Jul-2023 01:34, PREVIOUS ECG IS PRESENT Confirmed by Virgina Norfolk [phone]) on 07/28/2023 9:32:12 PM  Radiology DG Chest Portable 1 View  Result Date: 07/28/2023 CLINICAL DATA:  Anxiety with elevated blood pressure and heart rate. EXAM: PORTABLE CHEST 1 VIEW COMPARISON:  Mar 16, 2022 FINDINGS: The heart size and mediastinal contours are within normal limits. Both lungs are clear. The visualized skeletal structures are unremarkable. IMPRESSION: No active disease. Electronically Signed   By: Aram Candela M.D.   On: 07/28/2023 22:45    Procedures Procedures    Medications Ordered in  ED Medications - No data to display  ED Course/ Medical Decision Making/ A&P                                 Medical Decision Making Amount and/or Complexity of Data Reviewed Labs: ordered. Radiology: ordered.   KYMM SOCK is here with palpitations and hypertension.  Patient 170/130 blood pressure.  Improving however.  Heart rate originally in the 130s but now in the low 100s.  Sinus tachycardia on EKG  per my review and interpretation.  She has been on steroids and Benadryl the last few days from allergic reaction.  Is not having any headache or stroke symptoms.  She overall appears clinically well.  I do suspect symptoms are maybe secondary to prednisone use or anxiety.  But will evaluate for any sort of heart stress/ACS, PE, infectious process or electrolyte abnormality.  Will check CBC BMP troponin D-dimer chest x-ray and give IV fluids and reevaluate.  Per my review and interpretation of labs is no significant anemia or electrolyte abnormality or kidney injury or leukocytosis.  D-dimer is normal.  Troponin is unremarkable.  Heart rate now normalized in the 90s with a sinus rhythm after IV fluids.  Blood pressure improving as well.  I do think her symptoms could be secondary to prednisone use.  Recommend that she discontinue prednisone and Benadryl at this time.  Recommend that she continue to check her blood pressure at home and follow-up with her primary care doctor to see if she needs to start long-term management of the blood pressure.  She understands return precautions including stroke symptoms, severe chest pain.  Overall patient discharged in good condition.  Understands return precautions.  This chart was dictated using voice recognition software.  Despite best efforts to proofread,  errors can occur which can change the documentation meaning.         Final Clinical Impression(s) / ED Diagnoses Final diagnoses:  Palpitations    Rx / DC Orders ED Discharge Orders      None         Virgina Norfolk, DO 07/28/23 2254

## 2023-07-28 NOTE — Discharge Instructions (Addendum)
Discontinue prednisone and Benadryl.  Continue to monitor your blood pressure at home follow-up with your primary care doctor to discuss possibly the need to start blood pressure meds.  Please return if you have worsening symptoms including stroke symptoms, severe chest pain.

## 2023-09-04 ENCOUNTER — Ambulatory Visit (INDEPENDENT_AMBULATORY_CARE_PROVIDER_SITE_OTHER): Payer: Medicaid Other | Admitting: Allergy

## 2023-09-04 ENCOUNTER — Encounter: Payer: Self-pay | Admitting: Allergy

## 2023-09-04 ENCOUNTER — Other Ambulatory Visit: Payer: Self-pay

## 2023-09-04 VITALS — BP 126/88 | HR 83 | Temp 98.6°F | Resp 16 | Ht 66.0 in | Wt 167.7 lb

## 2023-09-04 DIAGNOSIS — T783XXD Angioneurotic edema, subsequent encounter: Secondary | ICD-10-CM | POA: Diagnosis not present

## 2023-09-04 DIAGNOSIS — R232 Flushing: Secondary | ICD-10-CM

## 2023-09-04 DIAGNOSIS — T7840XD Allergy, unspecified, subsequent encounter: Secondary | ICD-10-CM

## 2023-09-04 MED ORDER — FEXOFENADINE HCL 180 MG PO TABS: 180.0000 mg | ORAL_TABLET | Freq: Two times a day (BID) | ORAL | 2 refills | Status: DC

## 2023-09-04 MED ORDER — FAMOTIDINE 20 MG PO TABS: 20.0000 mg | ORAL_TABLET | Freq: Two times a day (BID) | ORAL | 2 refills | Status: DC

## 2023-09-04 NOTE — Patient Instructions (Addendum)
Facial flushing/reactions: Etiology unclear. Keep track of episodes and take pictures.  Start allegra (fexofenadine) 180mg  twice a day. May take allegra up to 2 pills twice a day.  If symptoms are not controlled or causes drowsiness let us know. Start Pepcid (famotidine) 20mg  twice a day.  Avoid the following potential triggers: alcohol, tight clothing, NSAIDs, hot showers and getting overheated. See below for proper skin care.  For mild symptoms you can take over the counter antihistamines such as Benadryl 1-2 tablets = 25-50mg  and monitor symptoms closely. If symptoms worsen or if you have severe symptoms including breathing issues, throat closure, significant swelling, whole body hives, severe diarrhea and vomiting, lightheadedness then inject epinephrine and seek immediate medical care afterwards. Emergency action plan given.  Get bloodwork. We are ordering labs, so please allow 1-2 weeks for the results to come back. With the newly implemented Cures Act, the labs might be visible to you at the same time that they become visible to me. However, I will not address the results until all of the results are back, so please be patient.   Return in about 4 weeks (around 10/02/2023). Or sooner if needed.  Skin care recommendations  Bath time: Always use lukewarm water. AVOID very hot or cold water. Keep bathing time to 5-10 minutes. Do NOT use bubble bath. Use a mild soap and use just enough to wash the dirty areas. Do NOT scrub skin vigorously.  After bathing, pat dry your skin with a towel. Do NOT rub or scrub the skin.  Moisturizers and prescriptions:  ALWAYS apply moisturizers immediately after bathing (within 3 minutes). This helps to lock-in moisture. Use the moisturizer several times a day over the whole body. Good summer moisturizers include: Aveeno, CeraVe, Cetaphil. Good winter moisturizers include: Aquaphor, Vaseline, Cerave, Cetaphil, Eucerin, Vanicream. When using  moisturizers along with medications, the moisturizer should be applied about one hour after applying the medication to prevent diluting effect of the medication or moisturize around where you applied the medications. When not using medications, the moisturizer can be continued twice daily as maintenance.  Laundry and clothing: Avoid laundry products with added color or perfumes. Use unscented hypo-allergenic laundry products such as Tide free, Cheer free & gentle, and All free and clear.  If the skin still seems dry or sensitive, you can try double-rinsing the clothes. Avoid tight or scratchy clothing such as wool. Do not use fabric softeners or dyer sheets.

## 2023-09-04 NOTE — Progress Notes (Signed)
Follow Up Note  RE: Shari Stewart MRN: [id number] DOB: 23-Mar-1976 Date of Office Visit: 09/04/2023  Referring provider: Courtney Paris, NP Primary care provider: Courtney Paris, NP  Chief Complaint: Urticaria and Angioedema  History of Present Illness: I had the pleasure of seeing Shari Stewart for a [facility]. She is a 47 y.o. female, who is being followed for allergic reactions. Her previous allergy office visit was on 11/30/2021 with Dr. Selena Batten. Today is a new complaint visit of allergic reactions .  Discussed the use of AI scribe software for clinical note transcription with the patient, who gave verbal consent to proceed.  The patient, with a history of random reactions and allergies, presents with recurrent episodes of facial flushing and swelling that started approximately two months ago. The patient reports these episodes occur randomly, with no identifiable triggers, and can happen at any time of the day. The patient describes the flushing as being localized to the face, particularly around the forehead, cheeks, and chin.  In addition to the facial flushing, the patient reports a significant amount of unexplained weight gain, specifically in the abdominal region, over the past two weeks. The patient also mentions increased urinary frequency, which is a new symptom. The patient has a history of depression, anxiety, and attention deficit disorder (ADD), and reports these conditions have become more pronounced recently.  The patient has sought medical attention multiple times for these symptoms, including visits to urgent care and the hospital. During one episode, the patient experienced a reaction after consuming a [place] roll which resulted in itching eyes, swelling of the lips and tongue, and difficulty breathing. The patient administered two doses of epinephrine, but the symptoms worsened, necessitating a visit to the  hospital.  The patient has undergone allergy testing, including skin prick tests and blood work, which were all negative at [facility]. The patient has tried various medications, including Benadryl, cetirizine, and famotidine, but reports these have not been effective in managing the symptoms - however has not been taking them recently.   The patient has a history of smoking, currently less than a pack a day, and is on Adderall for ADD. The patient also takes olanzapine, a non-benzodiazepine anti-anxiety medication, and propranolol, another anti-anxiety medication, at night. The patient reports no recent infections, changes in diet, or significant stressors, other than attending a friend's funeral and dealing with a spouse's hospitalization for pneumonia and a mechanical heart valve.  The patient has moved to a new house, free of mold, which was a concern in the past. The patient also reports breakthrough periods despite being in menopause for over a year. The patient has not had a mammogram in a couple of years and is overdue for one.   06/18/2023 UC visit: "47 year old female presenting with wasp stings that occurred yesterday. Per history patient developed anaphylaxis after the stings and self administered Epi with improvement in her symptoms though did not seek immediate follow-up care. Today after removing a stinger she appears to have had a rebound of anaphylaxis and again administered Epi. Patient has has no symptoms on anaphylaxis in the last 14 hours. Educated patient that anytime she administers Epi she should immediately follow-up for observation and monitoring. Patient stated she didn't want to wait I the ER. No concern for ongoing anaphylaxis at this time. She does however have a localized reaction to her right foot. Kenalog injection given to help. EpiPen  refill provided. 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 She states that same occurred earlier this evening around 10 PM when she started having itching to her face and swelling around her eyes. She is unsure of the trigger but states that she suspects it was the sushi she had for dinner around 6 PM. She states she has had anaphylaxis several times previously and has an Epi pen at home and she used it immediately when her symptoms began. Her significant other present at bedside then went to the pharmacy and picked up another EpiPen and around 11 PM when she was still having tongue and throat swelling she gave herself a second injection.  She then called EMS for transport as her symptoms were not completely resolving.  EMS gave her 50mg  of benadryl and transported here for evaluation.  Upon presentation, patient states that she is normally having pulmonary throat swelling.  She does note that she is still feeling 'flush' and having some itching. Denies chest pain or shortness of breath. No nausea or vomiting."  08/19/2023 UC visit: "HPI: Patient presents with concern for allergic reaction. She reports that yesterday she began feeling some itching of her tongue and redness to her face. This morning she had some worsening flushing of the face along with swelling around her eyes and cheeks. She also reports some ringing in her right ear. Itching in the mouth has now resolved. Reports that over the past 7 years she has had a recurrent history of similar allergic reactions that have an unknown trigger. She has seen multiple allergist for this in the past. She was recently seen in the emergency department on 07/25/2023 for anaphylaxis of unknown trigger. Reports that her anaphylactic reactions are typically delayed. She has not taken anything at home for her symptoms. Does have an appointment at 415 this afternoon with a new allergist to establish care. She currently denies any shortness of breath, chest tightness, dizziness,  lightheadedness, nausea, vomiting, diarrhea, abdominal pain. Denies any dysphagia, sore throat, oral swelling."  Reviewed labs and testing results from [facility] center:  Sed rate: normal CRP: normal ANA: negative Environmental allergy blood work: negative to mold panel, hymenoptera panel  Food allergy skin prick test: negative to apple, orange, peach, strawberry, cantaloupe, cucumber, chicken, cabbage, carrot, celery, potato, corn, shellfish and fish mix, crab, oyster, shrimp, codfish, salmon, tuna, peanut, soy, kidney bean, green pea, yeast, mushroom, avocado, banana, almond, pecan, walnut, sesame, milk, casein, wheat, egg.  Assessment and Plan: Oniyah is a 47 y.o. female with: Allergic reaction, subsequent encounter Angioedema, subsequent encounter Flushing Past history -  history of allergic reactions and not sure if the trigger was ever identified as she follows with an allergist at [facility]. More recently she had facial/lip swelling and chest flushing after getting her dentures put in [place]. Apparently she required epinephrine, zyrtec, benadryl, famotidine and prednisone. After that event, she kept having recurrence of the swelling and went to the ER and urgent care for this. She had bloodwork drawn which was negative to environmental allergies. Latex and tryptase level were still pending. Patient wants to know if we can test for the chemicals that were used in the dentures as currently she is edentulous making it difficult for her to eat. Plan is to go back to [place] to get her permanent teeth placed. Interim history - no issues until 2 months ago and  having recurrent episodes of facial flushing with no clear trigger. Previous allergy testing negative at [facility].  Etiology unclear. Keep track of episodes and take pictures.  Start allegra (fexofenadine) 180mg  twice a day. May take allegra up to 2 pills twice a day.  If symptoms are not controlled or causes  drowsiness let us know. Start Pepcid (famotidine) 20mg  twice a day.  Avoid the following potential triggers: alcohol, tight clothing, NSAIDs, hot showers and getting overheated. See below for proper skin care.  For mild symptoms you can take over the counter antihistamines such as Benadryl 1-2 tablets = 25-50mg  and monitor symptoms closely. If symptoms worsen or if you have severe symptoms including breathing issues, throat closure, significant swelling, whole body hives, severe diarrhea and vomiting, lightheadedness then inject epinephrine and seek immediate medical care afterwards. Emergency action plan given. Get bloodwork.  Follow up with PCP regarding menstrual irregularities.  Return in about 4 weeks (around 10/02/2023).  Meds ordered this encounter  Medications   famotidine (PEPCID) 20 MG tablet    Sig: Take 1 tablet (20 mg total) by mouth 2 (two) times daily.    Dispense:  60 tablet    Refill:  2   fexofenadine (ALLEGRA) 180 MG tablet    Sig: Take 1 tablet (180 mg total) by mouth in the morning and at bedtime.    Dispense:  60 tablet    Refill:  2   Lab Orders         Allergen Fire Ant         ALLERGEN, ANNATTO SEED         Allergen, Red (C[place]ine) Dye, [id number]         Alpha-Gal Panel         C1 Esterase Inhibitor         C1 esterase inhibitor, functional         C3 and C4         CBC with Differential/Platelet         Complement component c1q         Chronic Urticaria         Comprehensive metabolic panel         Tryptase         Thyroid Cascade Profile         Protein Electrophoresis, Urine Rflx.         Protein electrophoresis, serum         Allergen Gum Carageenan      Diagnostics: None.   Medication List:  Current Outpatient Medications  Medication Sig Dispense Refill   amphetamine-dextroamphetamine (ADDERALL XR) 30 MG 24 hr capsule Take 30 mg by mouth daily.     amphetamine-dextroamphetamine (ADDERALL) 15 MG tablet Take 15 mg by mouth 2 (two) times daily.      clonazePAM (KLONOPIN) 0.5 MG tablet SMARTSIG:0.5 Tablet(s) By Mouth 1-2 Times Daily PRN     famotidine (PEPCID) 20 MG tablet Take 1 tablet (20 mg total) by mouth 2 (two) times daily. 60 tablet 2   fexofenadine (ALLEGRA) 180 MG tablet Take 1 tablet (180 mg total) by mouth in the morning and at bedtime. 60 tablet 2   OLANZapine (ZYPREXA) 10 MG tablet Take 10 mg by mouth at bedtime.     propranolol (INDERAL) 20 MG tablet Take 40 mg by mouth 2 (two) times daily.     valACYclovir (VALTREX) 500 MG tablet Take 500 mg by mouth daily as needed (for cold sores).     EPINEPHrine  0.3 mg/0.3 mL IJ SOAJ injection Inject 0.3 mg into the muscle as needed for anaphylaxis.     No current facility-administered medications for this visit.   Allergies: Allergies  Allergen Reactions   Erythromycin Hives and Nausea And Vomiting   Azithromycin Nausea And Vomiting   Bee Pollen Swelling   Betadine [Povidone Iodine]     BLISTERS   Gris-Peg [Griseofulvin] Swelling   Other     Polymethyl methacrylate   I reviewed her past medical history, social history, family history, and environmental history and no significant changes have been reported from her previous visit.  Review of Systems  Constitutional:  Negative for appetite change, chills, fever and unexpected weight change.  HENT:  Negative for congestion and rhinorrhea.        Facial flushing  Eyes:  Negative for itching.  Respiratory:  Negative for cough, chest tightness, shortness of breath and wheezing.   Cardiovascular:  Negative for chest pain.  Gastrointestinal:  Positive for abdominal pain.       Bloating  Genitourinary:  Negative for difficulty urinating.  Skin:  Negative for rash.  Neurological:  Negative for headaches.    Objective: BP 126/88 (BP Location: Right Arm, Patient Position: Sitting, Cuff Size: Normal)   Pulse 83   Temp 98.6 F (37 C) (Temporal)   Resp 16   Ht 5\' 6"  (1.676 m)   Wt 167 lb 11.2 oz (76.1 kg)   SpO2 97%   BMI  27.07 kg/m  Body mass index is 27.07 kg/m. Physical Exam Vitals and nursing note reviewed.  Constitutional:      Appearance: Normal appearance. She is well-developed.  HENT:     Head: Normocephalic and atraumatic.     Comments: Right eyelid - slightly swollen.    Right Ear: Tympanic membrane and external ear normal.     Left Ear: Tympanic membrane and external ear normal.     Nose: Nose normal.     Mouth/Throat:     Mouth: Mucous membranes are moist.     Pharynx: Oropharynx is clear.  Eyes:     Conjunctiva/sclera: Conjunctivae normal.  Cardiovascular:     Rate and Rhythm: Normal rate and regular rhythm.     Heart sounds: Normal heart sounds. No murmur heard.    No friction rub. No gallop.  Pulmonary:     Effort: Pulmonary effort is normal.     Breath sounds: Normal breath sounds. No wheezing, rhonchi or rales.  Musculoskeletal:     Cervical back: Neck supple.  Skin:    General: Skin is warm.     Findings: No rash.     Comments: Erythematous facial hue.  Neurological:     Mental Status: She is alert and oriented to person, place, and time.  Psychiatric:        Behavior: Behavior normal.    Previous notes and tests were reviewed. The plan was reviewed with the patient/family, and all questions/concerned were addressed.  It was my pleasure to see Kniya today and participate in her care. Please feel free to contact me with any questions or concerns.  Sincerely,  Wyline Mood, DO Allergy & Immunology  [facility] office: [phone] [facility] office: [phone]

## 2023-09-05 ENCOUNTER — Other Ambulatory Visit: Payer: Self-pay | Admitting: Endocrinology

## 2023-09-05 DIAGNOSIS — Z1231 Encounter for screening mammogram for malignant neoplasm of breast: Secondary | ICD-10-CM

## 2023-09-06 ENCOUNTER — Encounter: Payer: Self-pay | Admitting: Allergy

## 2023-09-12 ENCOUNTER — Telehealth: Payer: Self-pay | Admitting: Allergy

## 2023-09-12 NOTE — Telephone Encounter (Signed)
Patient called stating the medication she was prescribed at her last visit on 10/30 are not working for her. Patient was advised at her last visit to take benedryl if her swelling did not go down. Patient states nothing she is doing is working. Patient asked if she should stop taking her medication I advised her to still take until she hears back from a nurse.

## 2023-09-16 NOTE — Telephone Encounter (Signed)
Per Provider 09/04/23 office notes:  Facial flushing/reactions: Etiology unclear. Keep track of episodes and take pictures.  Start allegra (fexofenadine) 180mg  twice a day. May take allegra up to 2 pills twice a day.  If symptoms are not controlled or causes drowsiness let us know. Start Pepcid (famotidine) 20mg  twice a day.  Avoid the following potential triggers: alcohol, tight clothing, NSAIDs, hot showers and getting overheated. See below for proper skin care.  For mild symptoms you can take over the counter antihistamines such as Benadryl 1-2 tablets = 25-50mg  and monitor symptoms closely. If symptoms worsen or if you have severe symptoms including breathing issues, throat closure, significant swelling, whole body hives, severe diarrhea and vomiting, lightheadedness then inject epinephrine and seek immediate medical care afterwards. Emergency action plan given.   All lab work has not finalized yet.   Called patient - DOB/NO DPR on file - LMOVM to contact office or check her myChart for updated message.  Forwarding message to provider.

## 2023-09-16 NOTE — Telephone Encounter (Signed)
Please call patient and ask what medications she is taking.   She can take up to allegra 2 pill twice a day, famotidine 20mg  twice a day and benadryl 25mg  to 50mg  every 6 hours as needed for breakthrough reactions.  She has appt with me on 11/13.  She can see me tomorrow in OR if interested. I have some morning office openings.  I'm still waiting on some lab results.

## 2023-09-17 NOTE — Progress Notes (Unsigned)
Follow Up Note  RE: Shari Stewart MRN: [id number] DOB: 1976/07/21 Date of Office Visit: 09/18/2023  Referring provider: Courtney Paris, NP Primary care provider: Courtney Paris, NP  Chief Complaint: No chief complaint on file.  History of Present Illness: I had the pleasure of seeing Shari Stewart for a follow up visit at the [facility] on 09/17/2023. She is a 47 y.o. female, who is being followed for allergic reactions, flushing, angioedema. Her previous allergy office visit was on 09/04/2023 with Dr. Selena Batten. Today is a regular follow up visit.  Discussed the use of AI scribe software for clinical note transcription with the patient, who gave verbal consent to proceed.  History of Present Illness            Allergic reaction, subsequent encounter Angioedema, subsequent encounter Flushing Past history -  history of allergic reactions and not sure if the trigger was ever identified as she follows with an allergist at [facility]. More recently she had facial/lip swelling and chest flushing after getting her dentures put in [place]. Apparently she required epinephrine, zyrtec, benadryl, famotidine and prednisone. After that event, she kept having recurrence of the swelling and went to the ER and urgent care for this. She had bloodwork drawn which was negative to environmental allergies. Latex and tryptase level were still pending. Patient wants to know if we can test for the chemicals that were used in the dentures as currently she is edentulous making it difficult for her to eat. Plan is to go back to [place] to get her permanent teeth placed. Interim history - no issues until 2 months ago and having recurrent episodes of facial flushing with no clear trigger. Previous allergy testing negative at [facility].  Etiology unclear. Keep track of episodes and take pictures.  Start allegra (fexofenadine) 180mg  twice a day. May take allegra up to 2 pills twice  a day.  If symptoms are not controlled or causes drowsiness let us know. Start Pepcid (famotidine) 20mg  twice a day.  Avoid the following potential triggers: alcohol, tight clothing, NSAIDs, hot showers and getting overheated. See below for proper skin care.  For mild symptoms you can take over the counter antihistamines such as Benadryl 1-2 tablets = 25-50mg  and monitor symptoms closely. If symptoms worsen or if you have severe symptoms including breathing issues, throat closure, significant swelling, whole body hives, severe diarrhea and vomiting, lightheadedness then inject epinephrine and seek immediate medical care afterwards. Emergency action plan given. Get bloodwork.   Follow up with PCP regarding menstrual irregularities.   Return in about 4 weeks (around 10/02/2023).  Assessment and Plan: [name] is a 47 y.o. female with: *** Assessment and Plan              No follow-ups on file.  No orders of the defined types were placed in this encounter.  Lab Orders  No laboratory test(s) ordered today    Diagnostics: Spirometry:  Tracings reviewed. Her effort: {Blank single:19197::"Good reproducible efforts.","It was hard to get consistent efforts and there is a question as to whether this reflects a maximal maneuver.","Poor effort, data can not be interpreted."} FVC: ***L FEV1: ***L, ***% predicted FEV1/FVC ratio: ***% Interpretation: {Blank single:19197::"Spirometry consistent with mild obstructive disease","Spirometry consistent with moderate obstructive disease","Spirometry consistent with severe obstructive disease","Spirometry consistent with possible restrictive disease","Spirometry consistent with mixed obstructive and restrictive disease","Spirometry uninterpretable due to technique","Spirometry consistent with normal pattern","No overt abnormalities noted given today's efforts"}.  Please see scanned  spirometry results for details.  Skin Testing: {Blank  single:19197::"Select foods","Environmental allergy panel","Environmental allergy panel and select foods","Food allergy panel","None","Deferred due to recent antihistamines use"}. *** Results discussed with patient/family.   Medication List:  Current Outpatient Medications  Medication Sig Dispense Refill   amphetamine-dextroamphetamine (ADDERALL XR) 30 MG 24 hr capsule Take 30 mg by mouth daily.     amphetamine-dextroamphetamine (ADDERALL) 15 MG tablet Take 15 mg by mouth 2 (two) times daily.     clonazePAM (KLONOPIN) 0.5 MG tablet SMARTSIG:0.5 Tablet(s) By Mouth 1-2 Times Daily PRN     EPINEPHrine 0.3 mg/0.3 mL IJ SOAJ injection Inject 0.3 mg into the muscle as needed for anaphylaxis.     famotidine (PEPCID) 20 MG tablet Take 1 tablet (20 mg total) by mouth 2 (two) times daily. 60 tablet 2   fexofenadine (ALLEGRA) 180 MG tablet Take 1 tablet (180 mg total) by mouth in the morning and at bedtime. 60 tablet 2   OLANZapine (ZYPREXA) 10 MG tablet Take 10 mg by mouth at bedtime.     propranolol (INDERAL) 20 MG tablet Take 40 mg by mouth 2 (two) times daily.     valACYclovir (VALTREX) 500 MG tablet Take 500 mg by mouth daily as needed (for cold sores).     No current facility-administered medications for this visit.   Allergies: Allergies  Allergen Reactions   Erythromycin Hives and Nausea And Vomiting   Azithromycin Nausea And Vomiting   Bee Pollen Swelling   Betadine [Povidone Iodine]     BLISTERS   Gris-Peg [Griseofulvin] Swelling   Other     Polymethyl methacrylate   I reviewed her past medical history, social history, family history, and environmental history and no significant changes have been reported from her previous visit.  Review of Systems  Constitutional:  Negative for appetite change, chills, fever and unexpected weight change.  HENT:  Negative for congestion and rhinorrhea.        Facial flushing  Eyes:  Negative for itching.  Respiratory:  Negative for cough, chest  tightness, shortness of breath and wheezing.   Cardiovascular:  Negative for chest pain.  Gastrointestinal:  Positive for abdominal pain.       Bloating  Genitourinary:  Negative for difficulty urinating.  Skin:  Negative for rash.  Neurological:  Negative for headaches.    Objective: There were no vitals taken for this visit. There is no height or weight on file to calculate BMI. Physical Exam Vitals and nursing note reviewed.  Constitutional:      Appearance: Normal appearance. She is well-developed.  HENT:     Head: Normocephalic and atraumatic.     Comments: Right eyelid - slightly swollen.    Right Ear: Tympanic membrane and external ear normal.     Left Ear: Tympanic membrane and external ear normal.     Nose: Nose normal.     Mouth/Throat:     Mouth: Mucous membranes are moist.     Pharynx: Oropharynx is clear.  Eyes:     Conjunctiva/sclera: Conjunctivae normal.  Cardiovascular:     Rate and Rhythm: Normal rate and regular rhythm.     Heart sounds: Normal heart sounds. No murmur heard.    No friction rub. No gallop.  Pulmonary:     Effort: Pulmonary effort is normal.     Breath sounds: Normal breath sounds. No wheezing, rhonchi or rales.  Musculoskeletal:     Cervical back: Neck supple.  Skin:    General: Skin is warm.  Findings: No rash.     Comments: Erythematous facial hue.  Neurological:     Mental Status: She is alert and oriented to person, place, and time.  Psychiatric:        Behavior: Behavior normal.    Previous notes and tests were reviewed. The plan was reviewed with the patient/family, and all questions/concerned were addressed.  It was my pleasure to see Passion today and participate in her care. Please feel free to contact me with any questions or concerns.  Sincerely,  Wyline Mood, DO Allergy & Immunology  [facility] office: [phone] [facility] office: [phone]

## 2023-09-17 NOTE — Telephone Encounter (Signed)
Lm for pt to call us back about this °

## 2023-09-18 ENCOUNTER — Ambulatory Visit (INDEPENDENT_AMBULATORY_CARE_PROVIDER_SITE_OTHER): Payer: Medicaid Other | Admitting: Allergy

## 2023-09-18 ENCOUNTER — Other Ambulatory Visit: Payer: Self-pay

## 2023-09-18 ENCOUNTER — Encounter: Payer: Self-pay | Admitting: Allergy

## 2023-09-18 VITALS — BP 130/90 | HR 136 | Temp 98.3°F | Resp 16 | Wt 164.7 lb

## 2023-09-18 DIAGNOSIS — R030 Elevated blood-pressure reading, without diagnosis of hypertension: Secondary | ICD-10-CM

## 2023-09-18 DIAGNOSIS — T7840XD Allergy, unspecified, subsequent encounter: Secondary | ICD-10-CM | POA: Diagnosis not present

## 2023-09-18 DIAGNOSIS — T783XXD Angioneurotic edema, subsequent encounter: Secondary | ICD-10-CM

## 2023-09-18 DIAGNOSIS — R232 Flushing: Secondary | ICD-10-CM | POA: Diagnosis not present

## 2023-09-18 NOTE — Patient Instructions (Addendum)
Facial flushing/reactions: Etiology unclear. Keep track of episodes and take pictures.  Stop famotidine and stop antihistamines.   Avoid the following potential triggers: alcohol, tight clothing, NSAIDs, hot showers and getting overheated. Continue proper skin care.  For mild symptoms you can take over the counter antihistamines such as Benadryl 1-2 tablets = 25-50mg  and monitor symptoms closely. If symptoms worsen or if you have severe symptoms including breathing issues, throat closure, significant swelling, whole body hives, severe diarrhea and vomiting, lightheadedness then inject epinephrine and seek immediate medical care afterwards. Emergency action plan in place.   Get bloodwork. We are ordering labs, so please allow 1-2 weeks for the results to come back. With the newly implemented Cures Act, the labs might be visible to you at the same time that they become visible to me. However, I will not address the results until all of the results are back, so please be patient.  Elevated blood pressure  Blood pressure reading was high in our office today. Vitals:   09/18/23 1112 09/18/23 1113  BP: (!) 150/90 (!) 150/100   Please follow up with PCP regarding this.     Keep follow up for end of November.  Skin care recommendations  Bath time: Always use lukewarm water. AVOID very hot or cold water. Keep bathing time to 5-10 minutes. Do NOT use bubble bath. Use a mild soap and use just enough to wash the dirty areas. Do NOT scrub skin vigorously.  After bathing, pat dry your skin with a towel. Do NOT rub or scrub the skin.  Moisturizers and prescriptions:  ALWAYS apply moisturizers immediately after bathing (within 3 minutes). This helps to lock-in moisture. Use the moisturizer several times a day over the whole body. Good summer moisturizers include: Aveeno, CeraVe, Cetaphil. Good winter moisturizers include: Aquaphor, Vaseline, Cerave, Cetaphil, Eucerin, Vanicream. When using  moisturizers along with medications, the moisturizer should be applied about one hour after applying the medication to prevent diluting effect of the medication or moisturize around where you applied the medications. When not using medications, the moisturizer can be continued twice daily as maintenance.  Laundry and clothing: Avoid laundry products with added color or perfumes. Use unscented hypo-allergenic laundry products such as Tide free, Cheer free & gentle, and All free and clear.  If the skin still seems dry or sensitive, you can try double-rinsing the clothes. Avoid tight or scratchy clothing such as wool. Do not use fabric softeners or dyer sheets.

## 2023-09-20 DIAGNOSIS — Z1231 Encounter for screening mammogram for malignant neoplasm of breast: Secondary | ICD-10-CM

## 2023-09-20 LAB — HEPATIC FUNCTION PANEL
ALT: 36 [IU]/L — ABNORMAL HIGH (ref 0–32)
AST: 25 [IU]/L (ref 0–40)
Albumin: 4.6 g/dL (ref 3.9–4.9)
Alkaline Phosphatase: 184 [IU]/L — ABNORMAL HIGH (ref 44–121)
Bilirubin Total: 0.5 mg/dL (ref 0.0–1.2)
Bilirubin, Direct: 0.14 mg/dL (ref 0.00–0.40)
Total Protein: 7.1 g/dL (ref 6.0–8.5)

## 2023-09-20 LAB — TRYPTASE: Tryptase: 14.6 ug/L — ABNORMAL HIGH (ref 2.2–13.2)

## 2023-10-02 ENCOUNTER — Encounter: Payer: Self-pay | Admitting: Allergy

## 2023-10-02 ENCOUNTER — Ambulatory Visit (INDEPENDENT_AMBULATORY_CARE_PROVIDER_SITE_OTHER): Payer: Medicaid Other | Admitting: Allergy

## 2023-10-02 DIAGNOSIS — R748 Abnormal levels of other serum enzymes: Secondary | ICD-10-CM

## 2023-10-02 DIAGNOSIS — T7840XD Allergy, unspecified, subsequent encounter: Secondary | ICD-10-CM | POA: Diagnosis not present

## 2023-10-02 DIAGNOSIS — R232 Flushing: Secondary | ICD-10-CM | POA: Diagnosis not present

## 2023-10-02 DIAGNOSIS — T783XXD Angioneurotic edema, subsequent encounter: Secondary | ICD-10-CM | POA: Diagnosis not present

## 2023-10-02 DIAGNOSIS — J3089 Other allergic rhinitis: Secondary | ICD-10-CM

## 2023-10-02 MED ORDER — CETIRIZINE HCL 10 MG PO TABS: 10.0000 mg | ORAL_TABLET | Freq: Two times a day (BID) | ORAL | 1 refills | Status: AC

## 2023-10-02 NOTE — Patient Instructions (Addendum)
Facial flushing/reactions: Etiology unclear. Keep track of episodes and take pictures.  Start zyrtec 10mg  twice a day - after you are done with the urine collection.  Avoid the following potential triggers: alcohol, tight clothing, NSAIDs, hot showers and getting overheated. Continue proper skin care.  For mild symptoms you can take over the counter antihistamines such as Benadryl 1-2 tablets = 25-50mg  and monitor symptoms closely. If symptoms worsen or if you have severe symptoms including breathing issues, throat closure, significant swelling, whole body hives, severe diarrhea and vomiting, lightheadedness then inject epinephrine and seek immediate medical care afterwards. Emergency action plan in place.   Get bloodwork and urine test We are ordering labs, so please allow 1-2 weeks for the results to come back. With the newly implemented Cures Act, the labs might be visible to you at the same time that they become visible to me. However, I will not address the results until all of the results are back, so please be patient.  In the meantime, continue recommendations in your patient instructions, including avoidance measures (if applicable), until you hear from me.  Patient should NOT be on any aspirin, indomethacin, or anti-inflammatory medications, if possible, for at least 72 hours prior to collection of specimen   Genetic testing [email or web address].[email or web address]/[email or web address] You can request a kit here OR come to our office to get it done.  Elevated liver enzymes Follow up with primary care provider next month.   Follow up in 4 weeks or sooner if needed.   Skin care recommendations  Bath time: Always use lukewarm water. AVOID very hot or cold water. Keep bathing time to 5-10 minutes. Do NOT use bubble bath. Use a mild soap and use just enough to wash the dirty areas. Do NOT scrub skin vigorously.  After bathing, pat dry your skin with a towel. Do NOT rub or scrub the  skin.  Moisturizers and prescriptions:  ALWAYS apply moisturizers immediately after bathing (within 3 minutes). This helps to lock-in moisture. Use the moisturizer several times a day over the whole body. Good summer moisturizers include: Aveeno, CeraVe, Cetaphil. Good winter moisturizers include: Aquaphor, Vaseline, Cerave, Cetaphil, Eucerin, Vanicream. When using moisturizers along with medications, the moisturizer should be applied about one hour after applying the medication to prevent diluting effect of the medication or moisturize around where you applied the medications. When not using medications, the moisturizer can be continued twice daily as maintenance.  Laundry and clothing: Avoid laundry products with added color or perfumes. Use unscented hypo-allergenic laundry products such as Tide free, Cheer free & gentle, and All free and clear.  If the skin still seems dry or sensitive, you can try double-rinsing the clothes. Avoid tight or scratchy clothing such as wool. Do not use fabric softeners or dyer sheets.

## 2023-10-02 NOTE — Progress Notes (Signed)
Follow Up Note  RE: Shari Stewart MRN: [id number] DOB: 01/17/1976 Date of Office Visit: 10/02/2023  Referring provider: Courtney Paris, NP Primary care provider: Courtney Paris, NP  Chief Complaint: Angioedema (Eye swelling Friday 09/27/23 and Monday 09/30/23)  History of Present Illness: I had the pleasure of seeing Shari Stewart for a follow up visit at the [facility] on 10/02/2023. She is a 47 y.o. female, who is being followed for allergic reactions, angioedema, flushing. Her previous allergy office visit was on 09/18/2023 with Dr. Selena Batten. Today is a regular follow up visit.  Discussed the use of AI scribe software for clinical note transcription with the patient, who gave verbal consent to proceed.  The patient, with a history of recurrent facial swelling, presents for follow-up. She reports episodes of eye swelling, which have persisted despite cessation of all medications. The swelling, which is localized to the eyes, occurs without a discernible pattern and is not associated with any changes in diet or lifestyle. The patient denies any changes in her medication regimen, which includes Adderall, Klonopin, Propranolol, and Valtrex. She also reports no changes in her smoking habits.  The patient notes that the swelling episodes typically last a couple of days and are sometimes accompanied by facial flushing. However, she has not experienced any flushing with the most recent episode. She also reports that her blood pressure has been stable.  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 However, she has not had any issues with her current fixed implants. She denies any dermatological reactions, with the exception of the facial swelling and occasional flushing.     Assessment and Plan: [name] is a 47 y.o. female with: Allergic reaction, subsequent encounter Angioedema, subsequent  encounter Elevated serum tryptase Past history -  history of allergic reactions and not sure if the trigger was ever identified as she follows with an allergist at [facility]. More recently she had facial/lip swelling and chest flushing after getting her dentures put in [place]. Apparently she required epinephrine, zyrtec, benadryl, famotidine and prednisone. After that event, she kept having recurrence of the swelling and went to the ER and urgent care for this. She had bloodwork drawn which was negative to environmental allergies. Latex and tryptase level were still pending. Patient wants to know if we can test for the chemicals that were used in the dentures as currently she is edentulous making it difficult for her to eat. Plan is to go back to [place] to get her permanent teeth placed. Previous allergy testing negative at [facility].  Interim history - Episodes of facial swelling, particularly around the eyes, with no clear trigger. Episodes last a couple of days and are not associated with flushing. No change in frequency or severity since stopping previous medications. Slightly elevated tryptase level. Etiology unclear. Keep track of episodes and take pictures.  Start zyrtec 10mg  twice a day - after you are done with the urine collection. Avoid the following potential triggers: alcohol, tight clothing, NSAIDs, hot showers and getting overheated. Continue proper skin care. For mild symptoms you can take over the counter antihistamines such as Benadryl 1-2 tablets = 25-50mg  and monitor symptoms closely. If symptoms worsen or if you have severe symptoms including breathing issues, throat closure, significant swelling, whole body hives, severe diarrhea and vomiting, lightheadedness then inject epinephrine and seek immediate medical care afterwards. Emergency action plan in place.  Get bloodwork and  urine test.  Flushing Waxes and wanes. Get bloodwork and urine test. Monitor  symptoms.  Elevated Liver Enzymes Persistent elevation in liver enzymes, though improving. Follow up with primary care provider next month.   Return in about 4 weeks (around 10/30/2023).  Meds ordered this encounter  Medications   cetirizine (ZYRTEC ALLERGY) 10 MG tablet    Sig: Take 1 tablet (10 mg total) by mouth 2 (two) times daily.    Dispense:  60 tablet    Refill:  1   Lab Orders         Tryptase         Food Allergy Profile         Allergens w/Total IgE Area 2         Catecholamines, fractionated, plasma         Metanephrines, Urine, 24 hour         Catecholamines, fractionated, Urine, 24 hour         Prostaglandin D2, serum         N-Methylhistamine, 24 Hr, U         Leukotriene E4, 24 hr, U         Other/Misc lab test      Diagnostics: None.   Medication List:  Current Outpatient Medications  Medication Sig Dispense Refill   amphetamine-dextroamphetamine (ADDERALL XR) 30 MG 24 hr capsule Take 30 mg by mouth daily.     amphetamine-dextroamphetamine (ADDERALL) 20 MG tablet Take 20 mg by mouth 2 (two) times daily.     cetirizine (ZYRTEC ALLERGY) 10 MG tablet Take 1 tablet (10 mg total) by mouth 2 (two) times daily. 60 tablet 1   clonazePAM (KLONOPIN) 0.5 MG tablet SMARTSIG:0.5 Tablet(s) By Mouth 1-2 Times Daily PRN     EPINEPHrine 0.3 mg/0.3 mL IJ SOAJ injection Inject 0.3 mg into the muscle as needed for anaphylaxis.     OLANZapine-Samidorphan (LYBALVI PO) Take 10 mg by mouth at bedtime.     propranolol (INDERAL) 20 MG tablet Take 40 mg by mouth 2 (two) times daily.     valACYclovir (VALTREX) 500 MG tablet Take 500 mg by mouth daily as needed (for cold sores).     No current facility-administered medications for this visit.   Allergies: Allergies  Allergen Reactions   Erythromycin Hives and Nausea And Vomiting   Azithromycin Nausea And Vomiting   Bee Pollen Swelling   Betadine [Povidone Iodine]     BLISTERS   Gris-Peg [Griseofulvin] Swelling   Other      Polymethyl methacrylate   I reviewed her past medical history, social history, family history, and environmental history and no significant changes have been reported from her previous visit.  Review of Systems  Constitutional:  Negative for appetite change, chills, fever and unexpected weight change.  HENT:  Positive for facial swelling. Negative for congestion and rhinorrhea.        Facial flushing  Eyes:  Negative for itching.  Respiratory:  Negative for cough, chest tightness, shortness of breath and wheezing.   Cardiovascular:  Negative for chest pain.  Genitourinary:  Negative for difficulty urinating.  Skin:  Negative for rash.  Neurological:  Negative for headaches.    Objective: There were no vitals taken for this visit. There is no height or weight on file to calculate BMI. Physical Exam Vitals and nursing note reviewed.  Constitutional:      Appearance: Normal appearance. She is well-developed.  HENT:     Head: Normocephalic and atraumatic.  Right Ear: Tympanic membrane and external ear normal.     Left Ear: Tympanic membrane and external ear normal.     Nose: Nose normal.     Mouth/Throat:     Mouth: Mucous membranes are moist.     Pharynx: Oropharynx is clear.  Eyes:     Conjunctiva/sclera: Conjunctivae normal.  Cardiovascular:     Rate and Rhythm: Normal rate and regular rhythm.     Heart sounds: Normal heart sounds. No murmur heard.    No friction rub. No gallop.  Pulmonary:     Effort: Pulmonary effort is normal.     Breath sounds: Normal breath sounds. No wheezing, rhonchi or rales.  Musculoskeletal:     Cervical back: Neck supple.  Skin:    General: Skin is warm.     Findings: No rash.     Comments: Erythematous facial hue.  Neurological:     Mental Status: She is alert and oriented to person, place, and time.  Psychiatric:        Behavior: Behavior normal.    Previous notes and tests were reviewed. The plan was reviewed with the  patient/family, and all questions/concerned were addressed.  It was my pleasure to see Shari Stewart today and participate in her care. Please feel free to contact me with any questions or concerns.  Sincerely,  Wyline Mood, DO Allergy & Immunology  [facility] office: [phone] [facility] office: [phone]

## 2023-10-03 ENCOUNTER — Emergency Department (HOSPITAL_COMMUNITY)
Admission: EM | Admit: 2023-10-03 | Discharge: 2023-10-04 | Disposition: A | Discharge status: Home / Self Care | Payer: Medicaid Other | Attending: Emergency Medicine | Admitting: Emergency Medicine

## 2023-10-03 ENCOUNTER — Other Ambulatory Visit: Payer: Self-pay

## 2023-10-03 ENCOUNTER — Encounter (HOSPITAL_COMMUNITY): Payer: Self-pay

## 2023-10-03 DIAGNOSIS — Z8541 Personal history of malignant neoplasm of cervix uteri: Secondary | ICD-10-CM | POA: Diagnosis not present

## 2023-10-03 DIAGNOSIS — T7840XA Allergy, unspecified, initial encounter: Secondary | ICD-10-CM | POA: Diagnosis present

## 2023-10-03 MED ORDER — DIPHENHYDRAMINE HCL 50 MG/ML IJ SOLN
25.0000 mg | Freq: Once | INTRAMUSCULAR | Status: AC
  Administered 2023-10-03: 25 mg via INTRAVENOUS
  Filled 2023-10-03: qty 1

## 2023-10-03 MED ORDER — FAMOTIDINE IN NACL 20-0.9 MG/50ML-% IV SOLN
20.0000 mg | Freq: Once | INTRAVENOUS | Status: AC
  Administered 2023-10-03: 20 mg via INTRAVENOUS
  Filled 2023-10-03: qty 50

## 2023-10-03 MED ORDER — METHYLPREDNISOLONE SODIUM SUCC 125 MG IJ SOLR
125.0000 mg | INTRAMUSCULAR | Status: AC
  Administered 2023-10-03: 125 mg via INTRAVENOUS
  Filled 2023-10-03: qty 2

## 2023-10-03 NOTE — ED Triage Notes (Signed)
Pt reports with redness and swelling to her face along with flushing to her chest area x 45 mins ago. Pt states that this has happened before and is being tested for allergies.

## 2023-10-04 NOTE — ED Provider Notes (Signed)
[place] EMERGENCY DEPARTMENT AT [facility] Provider Note   CSN: [id number] Arrival date & time: 10/03/23  2307     History  Chief Complaint  Patient presents with   Allergic Reaction    Shari Stewart is a 47 y.o. female.  The history is provided by the patient.  Allergic Reaction Presenting symptoms: no wheezing   Severity:  Moderate Prior episodes: many previous episodes over the last several years is under the care of allergy who has not yet found the source.  patient is tested negative.  is now seeing endocrinology for this.  cannot take oral benadryl or steroids. Relieved by:  Nothing Worsened by:  Nothing  Past Medical History:  Diagnosis Date   Allergy    Angio-edema    Cancer (HCC) 1993   Cervical   Medical history non-contributory        Home Medications Prior to Admission medications   Medication Sig Start Date End Date Taking? Authorizing Provider  amphetamine-dextroamphetamine (ADDERALL XR) 30 MG 24 hr capsule Take 30 mg by mouth daily.    [provider]  amphetamine-dextroamphetamine (ADDERALL) 20 MG tablet Take 20 mg by mouth 2 (two) times daily. 10/01/23   [provider]  cetirizine (ZYRTEC ALLERGY) 10 MG tablet Take 1 tablet (10 mg total) by mouth 2 (two) times daily. 10/02/23   Ellamae Sia, DO  clonazePAM (KLONOPIN) 0.5 MG tablet SMARTSIG:0.5 Tablet(s) By Mouth 1-2 Times Daily PRN 11/24/21   [provider]  EPINEPHrine 0.3 mg/0.3 mL IJ SOAJ injection Inject 0.3 mg into the muscle as needed for anaphylaxis.    [provider]  OLANZapine-Samidorphan (LYBALVI PO) Take 10 mg by mouth at bedtime.    [provider]  propranolol (INDERAL) 20 MG tablet Take 40 mg by mouth 2 (two) times daily. 09/04/23   [provider]  valACYclovir (VALTREX) 500 MG tablet Take 500 mg by mouth daily as needed (for cold sores).    [provider]      Allergies    Erythromycin, Azithromycin,  Bee pollen, Benadryl [diphenhydramine], Betadine [povidone iodine], Gris-peg [griseofulvin], and Other    Review of Systems   Review of Systems  Constitutional:  Negative for fever.  HENT:  Negative for facial swelling.   Respiratory:  Negative for wheezing and stridor.   Gastrointestinal:  Negative for vomiting.  All other systems reviewed and are negative.   Physical Exam Updated Vital Signs BP (!) 144/98   Pulse 88   Temp 97.8 F (36.6 C) (Oral)   Resp 16   Ht 5\' 6"  (1.676 m)   Wt 74.3 kg   SpO2 94%   BMI 26.42 kg/m  Physical Exam Vitals and nursing note reviewed. Exam conducted with a chaperone present.  Constitutional:      General: She is not in acute distress.    Appearance: She is well-developed.  HENT:     Head: Normocephalic and atraumatic.     Comments: Redness of the face.  No swelling of the lips tongue    Nose: Nose normal.     Mouth/Throat:     Mouth: Mucous membranes are moist.  Eyes:     Pupils: Pupils are equal, round, and reactive to light.  Cardiovascular:     Rate and Rhythm: Normal rate and regular rhythm.     Pulses: Normal pulses.     Heart sounds: Normal heart sounds.  Pulmonary:     Effort: Pulmonary effort is normal. No respiratory  distress.     Breath sounds: Normal breath sounds. No stridor. No wheezing.  Abdominal:     General: Bowel sounds are normal. There is no distension.     Palpations: Abdomen is soft.     Tenderness: There is no abdominal tenderness. There is no guarding or rebound.  Musculoskeletal:        General: Normal range of motion.     Cervical back: Neck supple.  Skin:    General: Skin is dry.     Capillary Refill: Capillary refill takes less than 2 seconds.     Findings: No erythema or rash.  Neurological:     General: No focal deficit present.     Deep Tendon Reflexes: Reflexes normal.  Psychiatric:        Mood and Affect: Mood normal.     ED Results / Procedures / Treatments   Labs (all labs ordered are  listed, but only abnormal results are displayed) Labs Reviewed - No data to display  EKG None  Radiology No results found.  Procedures Procedures    Medications Ordered in ED Medications  methylPREDNISolone sodium succinate (SOLU-MEDROL) 125 mg/2 mL injection 125 mg (125 mg Intravenous Given 10/03/23 2325)  famotidine (PEPCID) IVPB 20 mg premix (0 mg Intravenous Stopped 10/04/23 0021)  diphenhydrAMINE (BENADRYL) injection 25 mg (25 mg Intravenous Given 10/03/23 2325)    ED Course/ Medical Decision Making/ A&P                                 Medical Decision Making Patient with allergic reaction to unknown substance is under the care of an allergist   Amount and/or Complexity of Data Reviewed Independent Historian: spouse    Details: See above  External Data Reviewed: notes.    Details: Previous notes reviewed   Risk Prescription drug management. Risk Details: Patient states she needs a tryptase level.  EDP explained that we do not do those tests in the ED and that PMD vs. Allergist would need to do those tests.  Medications ordered in the ED.  Patient is stable post medication.  No airway swelling.  Discharged to home in I improved condition.  Follow up with your allergy specialist for ongoing care.      Final Clinical Impression(s) / ED Diagnoses Final diagnoses:  Allergic reaction, initial encounter   Return for intractable cough, coughing up blood, fevers > 100.4 unrelieved by medication, shortness of breath, intractable vomiting, chest pain, shortness of breath, weakness, numbness, changes in speech, facial asymmetry, abdominal pain, passing out, Inability to tolerate liquids or food, cough, altered mental status or any concerns. No signs of systemic illness or infection. The patient is nontoxic-appearing on exam and vital signs are within normal limits.  I have reviewed the triage vital signs and the nursing notes. Pertinent labs & imaging results that were available  during my care of the patient were reviewed by me and considered in my medical decision making (see chart for details). After history, exam, and medical workup I feel the patient has been appropriately medically screened and is safe for discharge home. Pertinent diagnoses were discussed with the patient. Patient was given return precautions.  Rx / DC Orders ED Discharge Orders     None         Kayin Kettering, MD 10/04/23 6213

## 2023-10-07 ENCOUNTER — Other Ambulatory Visit: Payer: Self-pay | Admitting: Allergy

## 2023-10-07 NOTE — Addendum Note (Signed)
Addended by: Dub Mikes on: 10/07/2023 03:15 PM   Modules accepted: Orders

## 2023-10-09 LAB — SPECIMEN STATUS REPORT

## 2023-10-10 LAB — METANEPHRINES, URINE, 24 HOUR
Metaneph Total, Ur: 90 ug/L
Metanephrines, 24H Ur: 149 ug/(24.h) (ref 36–209)
Normetanephrine, 24H Ur: 447 ug/(24.h) (ref 131–612)
Normetanephrine, Ur: 271 ug/L

## 2023-10-14 LAB — LEUKOTRIENE E4, 24 HR, U
Collection Duration: 24 h
Creatinine Concentration,24 HR: 93 mg/dL
Creatinine, 24 HR, U: 1535 mg/(24.h) (ref 603–1783)
Leukotriene E4, U: 112 pg/mg{creat} — ABNORMAL HIGH (ref <=104)
Urine Volume: 1650 mL

## 2023-10-14 LAB — CATECHOLAMINES, FRACTIONATED, URINE, 24 HOUR
Dopamine , 24H Ur: 50 ug/(24.h) (ref 0–510)
Dopamine, Rand Ur: 112 ug/L
Epinephrine, 24H Ur: 4 ug/(24.h) (ref 0–20)
Epinephrine, Rand Ur: 8 ug/L
Norepinephrine, 24H Ur: 25 ug/(24.h) (ref 0–135)
Norepinephrine, Rand Ur: 56 ug/L

## 2023-10-16 LAB — N-METHYLHISTAMINE, 24 HR, U
Creatinine Concent. 24 Hr, U: 93 mg/dL
Creatinine, 24 Hour, U: 1535 mg/(24.h) (ref 603–1783)
N-Methylhistamine, 24 Hr, U: 147 ug/g{creat} (ref 30–200)

## 2023-10-18 LAB — PROSTAGLANDIN D2, SERUM: Prostaglandin D2, serum: 10 pg/mL

## 2023-10-18 LAB — ALLERGENS W/TOTAL IGE AREA 2

## 2023-10-18 LAB — CATECHOLAMINES, FRACTIONATED, PLASMA
Dopamine: 54 pg/mL — ABNORMAL HIGH (ref 0–48)
Epinephrine: 51 pg/mL (ref 0–62)
Norepinephrine: 713 pg/mL (ref 0–874)

## 2023-10-18 LAB — FOOD ALLERGY PROFILE
Allergen Corn, IgE: <0.1 kU/L
Clam IgE: <0.1 kU/L
Codfish IgE: <0.1 kU/L
Egg White IgE: <0.1 kU/L
Milk IgE: <0.1 kU/L
Peanut IgE: <0.1 kU/L
Scallop IgE: <0.1 kU/L
Sesame Seed IgE: <0.1 kU/L
Shrimp IgE: <0.1 kU/L
Soybean IgE: <0.1 kU/L
Walnut IgE: <0.1 kU/L
Wheat IgE: <0.1 kU/L

## 2023-10-18 LAB — TRYPTASE: Tryptase: 16.2 ug/L — ABNORMAL HIGH (ref 2.2–13.2)

## 2023-10-28 LAB — OTHER LAB TEST: PDF: 0

## 2023-10-29 ENCOUNTER — Telehealth: Payer: Self-pay

## 2023-10-29 NOTE — Telephone Encounter (Signed)
Called Lab [phone] - requested lab test 2,3 Dinor - 11 Beta through self service.  Results received placed in provider in basket for review.  Forwarding updated message to provider.

## 2023-10-29 NOTE — Telephone Encounter (Signed)
-----   Message from Ellamae Sia sent at 10/28/2023  5:54 PM EST ----- Can someone get the [facility] lab results?  2,3 ZOXWR-60 BETA ----- Message ----- From: Interface, [facility] Lab Results In Sent: 10/09/2023   1:36 PM EST To: Ellamae Sia, DO

## 2023-11-10 LAB — ALPHA-GAL PANEL
Allergen Lamb IgE: <0.1 kU/L
Beef IgE: <0.1 kU/L
IgE (Immunoglobulin E), Serum: 59 [IU]/mL (ref 6–495)
O215-IgE Alpha-Gal: <0.1 kU/L
Pork IgE: <0.1 kU/L

## 2023-11-10 LAB — C1 ESTERASE INHIBITOR: C1INH SerPl-mCnc: 45 mg/dL — ABNORMAL HIGH (ref 21–39)

## 2023-11-10 LAB — COMPREHENSIVE METABOLIC PANEL
ALT: 111 [IU]/L — ABNORMAL HIGH (ref 0–32)
AST: 45 [IU]/L — ABNORMAL HIGH (ref 0–40)
Albumin: 4.3 g/dL (ref 3.9–4.9)
Alkaline Phosphatase: 207 [IU]/L — ABNORMAL HIGH (ref 44–121)
BUN/Creatinine Ratio: 9 (ref 9–23)
BUN: 7 mg/dL (ref 6–24)
Bilirubin Total: 0.6 mg/dL (ref 0.0–1.2)
CO2: 25 mmol/L (ref 20–29)
Calcium: 9.1 mg/dL (ref 8.7–10.2)
Chloride: 101 mmol/L (ref 96–106)
Creatinine, Ser: 0.77 mg/dL (ref 0.57–1.00)
Globulin, Total: 2.2 g/dL (ref 1.5–4.5)
Glucose: 75 mg/dL (ref 70–99)
Potassium: 3.7 mmol/L (ref 3.5–5.2)
Sodium: 140 mmol/L (ref 134–144)
Total Protein: 6.5 g/dL (ref 6.0–8.5)
eGFR: 95 mL/min/{1.73_m2} (ref >59)

## 2023-11-10 LAB — COMPLEMENT COMPONENT C1Q: Complement C1Q: 12.1 mg/dL (ref 10.3–20.5)

## 2023-11-10 LAB — PROTEIN ELECTROPHORESIS, SERUM
A/G Ratio: 1.2 (ref 0.7–1.7)
Albumin ELP: 3.5 g/dL (ref 2.9–4.4)
Alpha 1: 0.3 g/dL (ref 0.0–0.4)
Alpha 2: 0.9 g/dL (ref 0.4–1.0)
Beta: 1.2 g/dL (ref 0.7–1.3)
Gamma Globulin: 0.7 g/dL (ref 0.4–1.8)
Globulin, Total: 3 g/dL (ref 2.2–3.9)

## 2023-11-10 LAB — CBC WITH DIFFERENTIAL/PLATELET
Basophils Absolute: 0 10*3/uL (ref 0.0–0.2)
Basos: 1 %
EOS (ABSOLUTE): 0.1 10*3/uL (ref 0.0–0.4)
Eos: 1 %
Hematocrit: 41.2 % (ref 34.0–46.6)
Hemoglobin: 13.8 g/dL (ref 11.1–15.9)
Immature Grans (Abs): 0 10*3/uL (ref 0.0–0.1)
Immature Granulocytes: 0 %
Lymphocytes Absolute: 1.8 10*3/uL (ref 0.7–3.1)
Lymphs: 31 %
MCH: 31.6 pg (ref 26.6–33.0)
MCHC: 33.5 g/dL (ref 31.5–35.7)
MCV: 94 fL (ref 79–97)
Monocytes Absolute: 0.3 10*3/uL (ref 0.1–0.9)
Monocytes: 6 %
Neutrophils Absolute: 3.5 10*3/uL (ref 1.4–7.0)
Neutrophils: 61 %
Platelets: 272 10*3/uL (ref 150–450)
RBC: 4.37 x10E6/uL (ref 3.77–5.28)
RDW: 12.9 % (ref 11.7–15.4)
WBC: 5.7 10*3/uL (ref 3.4–10.8)

## 2023-11-10 LAB — PROTEIN ELECTROPHORESIS, URINE REFLEX
Albumin ELP, Urine: 100 %
Alpha-1-Globulin, U: 0 %
Alpha-2-Globulin, U: 0 %
Beta Globulin, U: 0 %
Gamma Globulin, U: 0 %
Protein, Ur: 4.6 mg/dL

## 2023-11-10 LAB — ALLERGEN, RED (CARMINE) DYE, RF340: F340-IgE Carmine Red Dye: <0.1 kU/L

## 2023-11-10 LAB — C3 AND C4
Complement C3, Serum: 177 mg/dL — ABNORMAL HIGH (ref 82–167)
Complement C4, Serum: 27 mg/dL (ref 12–38)

## 2023-11-10 LAB — ALLERGEN, ANNATTO SEED
Annatto Seed IgE*: <0.35 kU/L (ref <0.35)
Class Interpretation: 0

## 2023-11-10 LAB — ALLERGEN GUM CARAGEENAN

## 2023-11-10 LAB — CHRONIC URTICARIA: cu index: 8.6 (ref <10)

## 2023-11-10 LAB — C1 ESTERASE INHIBITOR, FUNCTIONAL: C1INH Functional/C1INH Total MFr SerPl: >110 %{normal}

## 2023-11-10 LAB — THYROID CASCADE PROFILE: TSH: 3.59 u[IU]/mL (ref 0.450–4.500)

## 2023-11-10 LAB — ALLERGEN FIRE ANT: I070-IgE Fire Ant (Invicta): <0.1 kU/L

## 2023-11-10 LAB — TRYPTASE: Tryptase: 14 ug/L — ABNORMAL HIGH (ref 2.2–13.2)

## 2023-11-13 ENCOUNTER — Other Ambulatory Visit: Payer: Self-pay

## 2023-11-13 ENCOUNTER — Ambulatory Visit (INDEPENDENT_AMBULATORY_CARE_PROVIDER_SITE_OTHER): Payer: Medicaid Other | Admitting: Allergy

## 2023-11-13 ENCOUNTER — Telehealth: Payer: Self-pay

## 2023-11-13 ENCOUNTER — Encounter: Payer: Self-pay | Admitting: Allergy

## 2023-11-13 VITALS — BP 140/100 | HR 103 | Temp 98.3°F | Resp 16 | Ht 66.0 in | Wt 174.1 lb

## 2023-11-13 DIAGNOSIS — R232 Flushing: Secondary | ICD-10-CM | POA: Diagnosis not present

## 2023-11-13 DIAGNOSIS — R220 Localized swelling, mass and lump, head: Secondary | ICD-10-CM | POA: Diagnosis not present

## 2023-11-13 DIAGNOSIS — Z720 Tobacco use: Secondary | ICD-10-CM

## 2023-11-13 DIAGNOSIS — R748 Abnormal levels of other serum enzymes: Secondary | ICD-10-CM | POA: Diagnosis not present

## 2023-11-13 DIAGNOSIS — R030 Elevated blood-pressure reading, without diagnosis of hypertension: Secondary | ICD-10-CM | POA: Diagnosis not present

## 2023-11-13 MED ORDER — MONTELUKAST SODIUM 10 MG PO TABS: 10.0000 mg | ORAL_TABLET | Freq: Every day | ORAL | 5 refills | Status: AC

## 2023-11-13 NOTE — Telephone Encounter (Signed)
-----   Message from Ellamae Sia sent at 11/13/2023  5:19 PM EST ----- Please place referral to [facility] allergy for allergic reactions, facial swelling - second opinion. Thank you.

## 2023-11-13 NOTE — Progress Notes (Signed)
 Follow Up Note  RE: Shari Stewart MRN: [id number] DOB: 09/08/1976 Date of Office Visit: 11/13/2023  Referring provider: Luke Orlan Christen, MD Primary care provider: Luke Orlan Christen, MD  Chief Complaint: Follow-up (Wants to review labs)  History of Present Illness: I had the pleasure of seeing Shari Stewart for a follow up visit at the [facility] on 11/14/2023. She is a 48 y.o. female, who is being followed for allergic reactions, angioedema, flushing . Her previous allergy  office visit was on 10/02/2023 with Dr. Luke. Today is a regular follow up visit.  Discussed the use of AI scribe software for clinical note transcription with the patient, who gave verbal consent to proceed.  The patient presents with a chief complaint of persistent facial swelling, which she describes as being more pronounced on some days and less on others. The swelling is not associated with any itching or other allergic symptoms, and the patient denies any correlation with dietary intake or sleep position. The patient also reports intermittent hand swelling, which has been severe enough to necessitate the removal of her wedding ring. Denies any trouble swallowing/eating or breathing during these episodes.  In addition to the facial swelling, the patient reports feeling swollen all the time and has noticed weight gain, which she attributes to her antidepressant medication. She also reports morning stiffness in her hands, wrists, and ankles.   The patient has a history of taking various allergy  medications, including cetirizine , Allegra , and Xyzal, but reports that these medications have not been effective in managing her symptoms. She also reports a history of taking montelukast  for allergy -related migraines, which she stopped taking prior to a dental procedure.  The patient has been keeping a detailed record of her symptoms, including photographs of her facial swelling. She reports that the swelling  can last for several days at a time and does not seem to respond to the application of ice packs.  She reports seeing a therapist weekly due to the stress of her ongoing health issues.  The patient has a history of elevated liver enzymes, which are currently being monitored and are reported to be improving. She has an upcoming appointment with an endo.  The patient continues to smoke, although she reports having cut back significantly. She also reports maintaining a regular exercise routine.     Reviewed labs  Urine catecholamines - normal Prostaglandin D2, serum - normal Environmental panel - negative Common foods panel - negative Tryptase - elevated at 16.2 Catecholamines, fractionated, plasma - slightly elevated dopamine N-Methylhistamine, 24 Hr, U - normal Leukotriene E4, U - slightly elevated Metanephrines, Urine, 24 hour - normal [facility] labs - 24-hour urine, 2,3-Dinor 11 Beta-Prostaglandin F2 Alpha - normal CU - normal.  LFTS - improving.  Assessment and Plan: Shari Stewart is a 48 y.o. female with: Facial swelling Flushing Elevated serum tryptase Past history -  history of allergic reactions and not sure if the trigger was ever identified as she followed with an allergist at [facility]. More recently she had facial/lip swelling and chest flushing after getting her dentures put in [place]. Apparently she required epinephrine , zyrtec , benadryl , famotidine  and prednisone . After that event, she kept having recurrence of the swelling and went to the ER and urgent care for this. She had bloodwork drawn which was negative to environmental allergies. Latex and tryptase level were still pending. Patient wants to know if we can test for the chemicals that were used in the dentures as currently  she is edentulous making it difficult for her to eat. Plan is to go back to [place] to get her permanent teeth placed. Previous allergy  testing negative at [facility]. Patient had dental work  completed.  Interim history - Chronic, intermittent facial swelling. No clear triggers identified. Patient reports no difficulty with breathing or swallowing during episodes. Swelling does not respond to antihistamines or ice packs. Etiology unclear. Difficult to appreciate significant facial swelling on the pictures provided.  Keep track of episodes and take pictures.  Start Singulair  (montelukast ) 10mg  daily at night - as patient used to be on this medication in the past and antihistamines seems to be ineffective.  Cautioned that in some children/adults can experience behavioral changes including hyperactivity, agitation, depression, sleep disturbances and suicidal ideations. These side effects are rare, but if you notice them you should notify me and discontinue Singulair  (montelukast ). Avoid the following potential triggers: alcohol, tight clothing, NSAIDs, hot showers and getting overheated. Continue proper skin care. For mild symptoms you can take over the counter antihistamines such as Benadryl  1-2 tablets = 25-50mg  and monitor symptoms closely. If symptoms worsen or if you have severe symptoms including breathing issues, throat closure, significant swelling, whole body hives, severe diarrhea and vomiting, lightheadedness then inject epinephrine  and seek immediate medical care afterwards. Emergency action plan in place.  Get tryptase level when you are NOT having facial swelling - the last few times the tryptase level was obtained patient had facial swelling.  [facility]  Keep appointment with endo. Discussed getting tested for alpha-tryptasemia which is a self-pay test. Patient will think about this. Consider also checking c-kit.   Elevated blood pressure reading Patient has home blood pressure monitor and reports variable readings. Follow up with PCP regarding this.   Return in about 3 months (around 02/11/2024).  Meds ordered this  encounter  Medications   montelukast  (SINGULAIR ) 10 MG tablet    Sig: Take 1 tablet (10 mg total) by mouth at bedtime.    Dispense:  30 tablet    Refill:  5   Lab Orders         Tryptase     Diagnostics: None.   Medication List:  Current Outpatient Medications  Medication Sig Dispense Refill   amphetamine-dextroamphetamine (ADDERALL XR) 30 MG 24 hr capsule Take 30 mg by mouth daily.     amphetamine-dextroamphetamine (ADDERALL) 20 MG tablet Take 20 mg by mouth 2 (two) times daily.     cetirizine  (ZYRTEC  ALLERGY ) 10 MG tablet Take 1 tablet (10 mg total) by mouth 2 (two) times daily. 60 tablet 1   clonazePAM (KLONOPIN) 0.5 MG tablet SMARTSIG:0.5 Tablet(s) By Mouth 1-2 Times Daily PRN     EPINEPHrine  0.3 mg/0.3 mL IJ SOAJ injection Inject 0.3 mg into the muscle as needed for anaphylaxis.     montelukast  (SINGULAIR ) 10 MG tablet Take 1 tablet (10 mg total) by mouth at bedtime. 30 tablet 5   OLANZapine-Samidorphan (LYBALVI PO) Take 10 mg by mouth at bedtime.     propranolol (INDERAL) 20 MG tablet Take 40 mg by mouth 2 (two) times daily.     valACYclovir (VALTREX) 500 MG tablet Take 500 mg by mouth daily as needed (for cold sores).     No current facility-administered medications for this visit.   Allergies: Allergies  Allergen Reactions   Erythromycin Hives and Nausea And Vomiting   Azithromycin Nausea And Vomiting   Bee Pollen Swelling   Benadryl  [Diphenhydramine ]  Betadine [Povidone Iodine]     BLISTERS   Gris-Peg [Griseofulvin] Swelling   Other     Polymethyl methacrylate   I reviewed her past medical history, social history, family history, and environmental history and no significant changes have been reported from her previous visit.  Review of Systems  Constitutional:  Negative for appetite change, chills, fever and unexpected weight change.  HENT:  Positive for facial swelling. Negative for congestion and rhinorrhea.        Facial flushing  Eyes:  Negative for  itching.  Respiratory:  Negative for cough, chest tightness, shortness of breath and wheezing.   Cardiovascular:  Negative for chest pain.  Genitourinary:  Negative for difficulty urinating.  Skin:  Negative for rash.  Neurological:  Negative for headaches.    Objective: BP (!) 140/100   Pulse (!) 103   Temp 98.3 F (36.8 C)   Resp 16   Ht 5' 6 (1.676 m)   Wt 174 lb 1.6 oz (79 kg)   SpO2 98%   BMI 28.10 kg/m  Body mass index is 28.1 kg/m. Physical Exam Vitals and nursing note reviewed.  Constitutional:      Appearance: Normal appearance. She is well-developed.  HENT:     Head: Normocephalic and atraumatic.     Comments: Slightly edematous on left face but difficult to fully appreciate - very subtle.     Right Ear: Tympanic membrane and external ear normal.     Left Ear: Tympanic membrane and external ear normal.     Nose: Nose normal.     Mouth/Throat:     Mouth: Mucous membranes are moist.     Pharynx: Oropharynx is clear.  Eyes:     Conjunctiva/sclera: Conjunctivae normal.  Cardiovascular:     Rate and Rhythm: Normal rate and regular rhythm.     Heart sounds: Normal heart sounds. No murmur heard.    No friction rub. No gallop.  Pulmonary:     Effort: Pulmonary effort is normal.     Breath sounds: Normal breath sounds. No wheezing, rhonchi or rales.  Musculoskeletal:     Cervical back: Neck supple.  Skin:    General: Skin is warm.     Findings: No rash.     Comments: Erythematous facial hue.  Neurological:     Mental Status: She is alert and oriented to person, place, and time.  Psychiatric:        Behavior: Behavior normal.    Previous notes and tests were reviewed. The plan was reviewed with the patient/family, and all questions/concerned were addressed.  It was my pleasure to see Shari Stewart today and participate in her care. Please feel free to contact me with any questions or concerns.  Sincerely,  Orlan Cramp, DO Allergy  & Immunology  [facility]  [place] office: [phone] [facility] office: [phone]

## 2023-11-13 NOTE — Patient Instructions (Addendum)
 Facial flushing/swelling:  Etiology unclear. Keep track of episodes and take pictures.  Start Singulair  (montelukast ) 10mg  daily at night. Cautioned that in some children/adults can experience behavioral changes including hyperactivity, agitation, depression, sleep disturbances and suicidal ideations. These side effects are rare, but if you notice them you should notify me and discontinue Singulair  (montelukast ). Avoid the following potential triggers: alcohol, tight clothing, NSAIDs, hot showers and getting overheated. Continue proper skin care.  For mild symptoms you can take over the counter antihistamines such as Benadryl  1-2 tablets = 25-50mg  and monitor symptoms closely. If symptoms worsen or if you have severe symptoms including breathing issues, throat closure, significant swelling, whole body hives, severe diarrhea and vomiting, lightheadedness then inject epinephrine  and seek immediate medical care afterwards. Emergency action plan in place.   Get tryptase level when you are NOT having facial swelling.  Referral to [facility] allergy  for second opinion.   Keep appointment with endocrinology.   Genetic testing [email or web address] You can request a kit here.  Elevated blood pressure  Blood pressure reading was high in our office today. Vitals:   11/13/23 1358  BP: (!) 140/100  Please follow up with PCP regarding this.    Follow up in 3 months or sooner if needed.

## 2023-11-14 ENCOUNTER — Encounter: Payer: Self-pay | Admitting: Allergy

## 2023-11-20 NOTE — Telephone Encounter (Signed)
 Please refer patient to the following office:   [facility] [place] (Allergy ) [address] [place], [place] [address] Phone: [phone] Fax: [phone]   Referrals are only accepted via Fax.    Thanks

## 2023-11-20 NOTE — Telephone Encounter (Signed)
 Prisma has been referred to  [facility] [place] (Allergy ) [address] [place], [place] [address] Phone: [phone] Fax: [phone]  I have faxed over the referral and all corresponding notes and labs.  I received the fax confirmation that this fax went through successfully.  They will reach out to the patient to schedule.  I informed patient via MyChart.
